# Patient Record
Sex: Female | Born: 1945 | Race: Black or African American | Hispanic: No | Smoking: Former smoker
Health system: Southern US, Community
[De-identification: ages and names within clinical notes are randomized; demographics above are authoritative.]

## PROBLEM LIST (undated history)

## (undated) DIAGNOSIS — E079 Disorder of thyroid, unspecified: Secondary | ICD-10-CM

## (undated) DIAGNOSIS — D509 Iron deficiency anemia, unspecified: Secondary | ICD-10-CM

## (undated) HISTORY — DX: Disorder of thyroid, unspecified: E07.9

## (undated) HISTORY — DX: Iron deficiency anemia, unspecified: D50.9

## (undated) HISTORY — PX: ABDOMINAL HYSTERECTOMY: SHX81

---

## 2008-11-24 ENCOUNTER — Ambulatory Visit (HOSPITAL_COMMUNITY): Admission: RE | Admit: 2008-11-24 | Discharge: 2008-11-24 | Payer: Self-pay | Admitting: Family Medicine

## 2008-12-18 ENCOUNTER — Ambulatory Visit (HOSPITAL_COMMUNITY): Admission: RE | Admit: 2008-12-18 | Discharge: 2008-12-18 | Payer: Self-pay | Admitting: Family Medicine

## 2009-01-13 ENCOUNTER — Ambulatory Visit (HOSPITAL_COMMUNITY): Admission: RE | Admit: 2009-01-13 | Discharge: 2009-01-13 | Payer: Self-pay | Admitting: Family Medicine

## 2009-10-05 ENCOUNTER — Ambulatory Visit (HOSPITAL_COMMUNITY): Admission: RE | Admit: 2009-10-05 | Discharge: 2009-10-05 | Payer: Self-pay | Admitting: Family Medicine

## 2010-02-24 ENCOUNTER — Ambulatory Visit (HOSPITAL_COMMUNITY): Admission: RE | Admit: 2010-02-24 | Discharge: 2010-02-24 | Payer: Self-pay | Admitting: Family Medicine

## 2010-05-03 ENCOUNTER — Ambulatory Visit (HOSPITAL_COMMUNITY): Admission: RE | Admit: 2010-05-03 | Discharge: 2010-05-03 | Payer: Self-pay | Admitting: Obstetrics & Gynecology

## 2011-05-25 ENCOUNTER — Other Ambulatory Visit (HOSPITAL_COMMUNITY): Payer: Self-pay | Admitting: Family Medicine

## 2011-05-25 DIAGNOSIS — Z139 Encounter for screening, unspecified: Secondary | ICD-10-CM

## 2011-05-29 ENCOUNTER — Ambulatory Visit (HOSPITAL_COMMUNITY): Payer: Self-pay

## 2011-06-08 ENCOUNTER — Ambulatory Visit (HOSPITAL_COMMUNITY)
Admission: RE | Admit: 2011-06-08 | Discharge: 2011-06-08 | Disposition: A | Discharge status: Home / Self Care | Payer: Medicaid Other | Source: Ambulatory Visit | Attending: Family Medicine | Admitting: Family Medicine

## 2011-06-08 DIAGNOSIS — Z1231 Encounter for screening mammogram for malignant neoplasm of breast: Secondary | ICD-10-CM | POA: Insufficient documentation

## 2011-06-08 DIAGNOSIS — Z139 Encounter for screening, unspecified: Secondary | ICD-10-CM

## 2011-07-13 ENCOUNTER — Other Ambulatory Visit: Payer: Self-pay | Admitting: Obstetrics & Gynecology

## 2012-05-30 ENCOUNTER — Other Ambulatory Visit (HOSPITAL_COMMUNITY): Payer: Self-pay | Admitting: Family Medicine

## 2012-05-30 DIAGNOSIS — Z139 Encounter for screening, unspecified: Secondary | ICD-10-CM

## 2012-06-10 ENCOUNTER — Ambulatory Visit (HOSPITAL_COMMUNITY)
Admission: RE | Admit: 2012-06-10 | Discharge: 2012-06-10 | Disposition: A | Discharge status: Home / Self Care | Payer: Medicaid Other | Source: Ambulatory Visit | Attending: Family Medicine | Admitting: Family Medicine

## 2012-06-10 DIAGNOSIS — Z139 Encounter for screening, unspecified: Secondary | ICD-10-CM

## 2012-06-10 DIAGNOSIS — Z1231 Encounter for screening mammogram for malignant neoplasm of breast: Secondary | ICD-10-CM | POA: Insufficient documentation

## 2013-06-30 ENCOUNTER — Other Ambulatory Visit (HOSPITAL_COMMUNITY): Payer: Self-pay | Admitting: Family Medicine

## 2013-06-30 DIAGNOSIS — Z139 Encounter for screening, unspecified: Secondary | ICD-10-CM

## 2013-07-15 ENCOUNTER — Ambulatory Visit (HOSPITAL_COMMUNITY)
Admission: RE | Admit: 2013-07-15 | Discharge: 2013-07-15 | Disposition: A | Discharge status: Home / Self Care | Payer: Medicare Other | Source: Ambulatory Visit | Attending: Family Medicine | Admitting: Family Medicine

## 2013-07-15 ENCOUNTER — Encounter: Payer: Self-pay | Admitting: Obstetrics & Gynecology

## 2013-07-15 ENCOUNTER — Ambulatory Visit (INDEPENDENT_AMBULATORY_CARE_PROVIDER_SITE_OTHER): Payer: Medicare Other | Admitting: Obstetrics & Gynecology

## 2013-07-15 VITALS — BP 140/80 | Ht 68.0 in | Wt 141.0 lb

## 2013-07-15 DIAGNOSIS — Z1231 Encounter for screening mammogram for malignant neoplasm of breast: Secondary | ICD-10-CM | POA: Insufficient documentation

## 2013-07-15 DIAGNOSIS — Z1212 Encounter for screening for malignant neoplasm of rectum: Secondary | ICD-10-CM

## 2013-07-15 DIAGNOSIS — Z139 Encounter for screening, unspecified: Secondary | ICD-10-CM

## 2013-07-15 DIAGNOSIS — Z01419 Encounter for gynecological examination (general) (routine) without abnormal findings: Secondary | ICD-10-CM

## 2013-07-15 NOTE — Progress Notes (Signed)
Patient ID: Kendra Ayala, female   DOB: 07/28/1946, 67 y.o.   MRN: [id number] Subjective:     Kendra Ayala is a 67 y.o. female here for a routine exam.  No LMP recorded. Patient has had a hysterectomy. No obstetric history on file. Current complaints: none. .   Gynecologic History No LMP recorded. Patient has had a hysterectomy. Contraception: none Last Pap: na. Results were: na Last mammogram: 2014. Results were: pending  History reviewed. No pertinent past medical history.  History reviewed. No pertinent past surgical history.  OB History   Grav Para Term Preterm Abortions TAB SAB Ect Mult Living                  History   Social History  . Marital Status: Divorced    Spouse Name: N/A    Number of Children: N/A  . Years of Education: N/A   Social History Main Topics  . Smoking status: Former Games developer  . Smokeless tobacco: None  . Alcohol Use: None  . Drug Use: None  . Sexual Activity: None   Other Topics Concern  . None   Social History Narrative  . None    History reviewed. No pertinent family history.   Review of Systems  Review of Systems  Constitutional: Negative for fever, chills, weight loss, malaise/fatigue and diaphoresis.  HENT: Negative for hearing loss, ear pain, nosebleeds, congestion, sore throat, neck pain, tinnitus and ear discharge.   Eyes: Negative for blurred vision, double vision, photophobia, pain, discharge and redness.  Respiratory: Negative for cough, hemoptysis, sputum production, shortness of breath, wheezing and stridor.   Cardiovascular: Negative for chest pain, palpitations, orthopnea, claudication, leg swelling and PND.  Gastrointestinal: negative for abdominal pain. Negative for heartburn, nausea, vomiting, diarrhea, constipation, blood in stool and melena.  Genitourinary: Negative for dysuria, urgency, frequency, hematuria and flank pain.  Musculoskeletal: Negative for myalgias, back pain, joint pain and falls.  Skin: Negative  for itching and rash.  Neurological: Negative for dizziness, tingling, tremors, sensory change, speech change, focal weakness, seizures, loss of consciousness, weakness and headaches.  Endo/Heme/Allergies: Negative for environmental allergies and polydipsia. Does not bruise/bleed easily.  Psychiatric/Behavioral: Negative for depression, suicidal ideas, hallucinations, memory loss and substance abuse. The patient is not nervous/anxious and does not have insomnia.        Objective:    Physical Exam  Vitals reviewed. Constitutional: She is oriented to person, place, and time. She appears well-developed and well-nourished.  HENT:  Head: Normocephalic and atraumatic.        Right Ear: External ear normal.  Left Ear: External ear normal.  Nose: Nose normal.  Mouth/Throat: Oropharynx is clear and moist.  Eyes: Conjunctivae and EOM are normal. Pupils are equal, round, and reactive to light. Right eye exhibits no discharge. Left eye exhibits no discharge. No scleral icterus.  Neck: Normal range of motion. Neck supple. No tracheal deviation present. No thyromegaly present.  Cardiovascular: Normal rate, regular rhythm, normal heart sounds and intact distal pulses.  Exam reveals no gallop and no friction rub.   No murmur heard. Respiratory: Effort normal and breath sounds normal. No respiratory distress. She has no wheezes. She has no rales. She exhibits no tenderness.  GI: Soft. Bowel sounds are normal. She exhibits no distension and no mass. There is no tenderness. There is no rebound and no guarding.  Genitourinary:  Breasts no masses skin changes or nipple changes bilaterally      Vulva is normal without lesions  Vagina is pink moist without discharge Cervix absent Uterus is absent Adnexa is absent Rectal    hemoccult negative, normal tone, no masses  Musculoskeletal: Normal range of motion. She exhibits no edema and no tenderness.  Neurological: She is alert and oriented to person, place,  and time. She has normal reflexes. She displays normal reflexes. No cranial nerve deficit. She exhibits normal muscle tone. Coordination normal.  Skin: Skin is warm and dry. No rash noted. No erythema. No pallor.  Psychiatric: She has a normal mood and affect. Her behavior is normal. Judgment and thought content normal.       Assessment:    Healthy female exam.    Plan:    Follow up in: 1 year.

## 2013-10-20 ENCOUNTER — Other Ambulatory Visit (HOSPITAL_COMMUNITY): Payer: Self-pay | Admitting: Family Medicine

## 2013-10-20 ENCOUNTER — Ambulatory Visit (HOSPITAL_COMMUNITY)
Admission: RE | Admit: 2013-10-20 | Discharge: 2013-10-20 | Disposition: A | Discharge status: Home / Self Care | Payer: Medicare Other | Source: Ambulatory Visit | Attending: Family Medicine | Admitting: Family Medicine

## 2013-10-20 DIAGNOSIS — M5137 Other intervertebral disc degeneration, lumbosacral region: Secondary | ICD-10-CM | POA: Insufficient documentation

## 2013-10-20 DIAGNOSIS — M5430 Sciatica, unspecified side: Secondary | ICD-10-CM

## 2013-10-20 DIAGNOSIS — M47817 Spondylosis without myelopathy or radiculopathy, lumbosacral region: Secondary | ICD-10-CM | POA: Insufficient documentation

## 2013-10-20 DIAGNOSIS — M51379 Other intervertebral disc degeneration, lumbosacral region without mention of lumbar back pain or lower extremity pain: Secondary | ICD-10-CM | POA: Insufficient documentation

## 2013-10-20 DIAGNOSIS — M25562 Pain in left knee: Secondary | ICD-10-CM

## 2013-10-20 DIAGNOSIS — M25569 Pain in unspecified knee: Secondary | ICD-10-CM | POA: Insufficient documentation

## 2014-06-11 ENCOUNTER — Other Ambulatory Visit (HOSPITAL_COMMUNITY): Payer: Self-pay | Admitting: Family Medicine

## 2014-06-11 DIAGNOSIS — Z1231 Encounter for screening mammogram for malignant neoplasm of breast: Secondary | ICD-10-CM

## 2014-07-23 ENCOUNTER — Encounter: Payer: Self-pay | Admitting: Obstetrics & Gynecology

## 2014-07-23 ENCOUNTER — Ambulatory Visit (HOSPITAL_COMMUNITY)
Admission: RE | Admit: 2014-07-23 | Discharge: 2014-07-23 | Disposition: A | Discharge status: Home / Self Care | Payer: Commercial Managed Care - HMO | Source: Ambulatory Visit | Attending: Family Medicine | Admitting: Family Medicine

## 2014-07-23 ENCOUNTER — Ambulatory Visit (INDEPENDENT_AMBULATORY_CARE_PROVIDER_SITE_OTHER): Payer: Commercial Managed Care - HMO | Admitting: Obstetrics & Gynecology

## 2014-07-23 VITALS — BP 100/60 | Ht 67.0 in | Wt 142.0 lb

## 2014-07-23 DIAGNOSIS — Z1272 Encounter for screening for malignant neoplasm of vagina: Secondary | ICD-10-CM | POA: Diagnosis not present

## 2014-07-23 DIAGNOSIS — Z1231 Encounter for screening mammogram for malignant neoplasm of breast: Secondary | ICD-10-CM | POA: Insufficient documentation

## 2014-07-23 DIAGNOSIS — Z90710 Acquired absence of both cervix and uterus: Secondary | ICD-10-CM | POA: Diagnosis not present

## 2014-07-23 DIAGNOSIS — Z1211 Encounter for screening for malignant neoplasm of colon: Secondary | ICD-10-CM | POA: Diagnosis not present

## 2014-07-23 DIAGNOSIS — Z1212 Encounter for screening for malignant neoplasm of rectum: Secondary | ICD-10-CM | POA: Diagnosis not present

## 2014-07-23 DIAGNOSIS — Z01419 Encounter for gynecological examination (general) (routine) without abnormal findings: Secondary | ICD-10-CM

## 2014-07-23 NOTE — Progress Notes (Signed)
Patient ID: Kendra Ayala, female   DOB: 1945/09/27, 68 y.o.   MRN: [id number] Subjective:     Kendra Ayala is a 68 y.o. female here for a routine exam.  No LMP recorded. Patient has had a hysterectomy. No obstetric history on file. Birth Control Method:  na Menstrual Calendar(currently): amenorrheic  Current complaints: none.   Current acute medical issues:  none   Recent Gynecologic History No LMP recorded. Patient has had a hysterectomy. Last Pap: years ago,   Last mammogram: 2015,  normal  History reviewed. No pertinent past medical history.  History reviewed. No pertinent past surgical history.  OB History    No data available      History   Social History  . Marital Status: Divorced    Spouse Name: N/A    Number of Children: N/A  . Years of Education: N/A   Social History Main Topics  . Smoking status: Former Games developermoker  . Smokeless tobacco: None  . Alcohol Use: None  . Drug Use: None  . Sexual Activity: None   Other Topics Concern  . None   Social History Narrative    History reviewed. No pertinent family history.  Current outpatient prescriptions: acetaminophen (TYLENOL) 325 MG tablet, Take 650 mg by mouth every 6 (six) hours as needed for pain., Disp: , Rfl: ;  alendronate (FOSAMAX) 70 MG tablet, Take 70 mg by mouth every 7 (seven) days. Take with a full glass of water on an empty stomach., Disp: , Rfl: ;  calcium & magnesium carbonates (MYLANTA) 311-232 MG per tablet, Take 1 tablet by mouth daily., Disp: , Rfl:  fexofenadine (ALLEGRA) 180 MG tablet, Take 180 mg by mouth daily., Disp: , Rfl: ;  FLUoxetine (PROZAC) 20 MG tablet, Take by mouth daily., Disp: , Rfl: ;  levothyroxine (SYNTHROID, LEVOTHROID) 50 MCG tablet, Take 50 mcg by mouth daily before breakfast., Disp: , Rfl: ;  omeprazole (PRILOSEC) 40 MG capsule, Take 40 mg by mouth daily., Disp: , Rfl:  Vitamin D, Ergocalciferol, (DRISDOL) 50000 UNITS CAPS capsule, Take 50,000 Units by mouth., Disp: , Rfl:    Review of Systems  Review of Systems  Constitutional: Negative for fever, chills, weight loss, malaise/fatigue and diaphoresis.  HENT: Negative for hearing loss, ear pain, nosebleeds, congestion, sore throat, neck pain, tinnitus and ear discharge.   Eyes: Negative for blurred vision, double vision, photophobia, pain, discharge and redness.  Respiratory: Negative for cough, hemoptysis, sputum production, shortness of breath, wheezing and stridor.   Cardiovascular: Negative for chest pain, palpitations, orthopnea, claudication, leg swelling and PND.  Gastrointestinal: negative for abdominal pain. Negative for heartburn, nausea, vomiting, diarrhea, constipation, blood in stool and melena.  Genitourinary: Negative for dysuria, urgency, frequency, hematuria and flank pain.  Musculoskeletal: Negative for myalgias, back pain, joint pain and falls.  Skin: Negative for itching and rash.  Neurological: Negative for dizziness, tingling, tremors, sensory change, speech change, focal weakness, seizures, loss of consciousness, weakness and headaches.  Endo/Heme/Allergies: Negative for environmental allergies and polydipsia. Does not bruise/bleed easily.  Psychiatric/Behavioral: Negative for depression, suicidal ideas, hallucinations, memory loss and substance abuse. The patient is not nervous/anxious and does not have insomnia.        Objective:  Blood pressure 100/60, height 5\' 7"  (1.702 m), weight 142 lb (64.411 kg).   Physical Exam  Vitals reviewed. Constitutional: She is oriented to person, place, and time. She appears well-developed and well-nourished.  HENT:  Head: Normocephalic and atraumatic.  Right Ear: External ear normal.  Left Ear: External ear normal.  Nose: Nose normal.  Mouth/Throat: Oropharynx is clear and moist.  Eyes: Conjunctivae and EOM are normal. Pupils are equal, round, and reactive to light. Right eye exhibits no discharge. Left eye exhibits no discharge. No scleral  icterus.  Neck: Normal range of motion. Neck supple. No tracheal deviation present. No thyromegaly present.  Cardiovascular: Normal rate, regular rhythm, normal heart sounds and intact distal pulses.  Exam reveals no gallop and no friction rub.   No murmur heard. Respiratory: Effort normal and breath sounds normal. No respiratory distress. She has no wheezes. She has no rales. She exhibits no tenderness.  GI: Soft. Bowel sounds are normal. She exhibits no distension and no mass. There is no tenderness. There is no rebound and no guarding.  Genitourinary:  Breasts no masses skin changes or nipple changes bilaterally      Vulva is normal without lesions Vagina is pink moist without discharge Cervix absent Uterus is absent Adnexa is negative  Rectal    hemoccult negative, normal tone, no masses  Musculoskeletal: Normal range of motion. She exhibits no edema and no tenderness.  Neurological: She is alert and oriented to person, place, and time. She has normal reflexes. She displays normal reflexes. No cranial nerve deficit. She exhibits normal muscle tone. Coordination normal.  Skin: Skin is warm and dry. No rash noted. No erythema. No pallor.  Psychiatric: She has a normal mood and affect. Her behavior is normal. Judgment and thought content normal.       Assessment:    Normal gyn yearly exam.    Plan:    Mammogram ordered. Follow up in: 2 years.

## 2014-08-12 ENCOUNTER — Other Ambulatory Visit (HOSPITAL_COMMUNITY): Payer: Self-pay | Admitting: Family Medicine

## 2014-08-12 DIAGNOSIS — M810 Age-related osteoporosis without current pathological fracture: Secondary | ICD-10-CM

## 2014-08-17 ENCOUNTER — Other Ambulatory Visit (HOSPITAL_COMMUNITY): Payer: Medicare HMO

## 2014-08-21 ENCOUNTER — Ambulatory Visit (HOSPITAL_COMMUNITY)
Admission: RE | Admit: 2014-08-21 | Discharge: 2014-08-21 | Disposition: A | Discharge status: Home / Self Care | Payer: Commercial Managed Care - HMO | Source: Ambulatory Visit | Attending: Family Medicine | Admitting: Family Medicine

## 2014-08-21 DIAGNOSIS — M818 Other osteoporosis without current pathological fracture: Secondary | ICD-10-CM | POA: Insufficient documentation

## 2014-08-21 DIAGNOSIS — M810 Age-related osteoporosis without current pathological fracture: Secondary | ICD-10-CM

## 2014-10-05 DIAGNOSIS — M5441 Lumbago with sciatica, right side: Secondary | ICD-10-CM | POA: Diagnosis not present

## 2014-10-05 DIAGNOSIS — E780 Pure hypercholesterolemia: Secondary | ICD-10-CM | POA: Diagnosis not present

## 2014-10-05 DIAGNOSIS — Z79891 Long term (current) use of opiate analgesic: Secondary | ICD-10-CM | POA: Diagnosis not present

## 2014-10-05 DIAGNOSIS — M8580 Other specified disorders of bone density and structure, unspecified site: Secondary | ICD-10-CM | POA: Diagnosis not present

## 2014-10-05 DIAGNOSIS — E559 Vitamin D deficiency, unspecified: Secondary | ICD-10-CM | POA: Diagnosis not present

## 2014-10-05 DIAGNOSIS — M810 Age-related osteoporosis without current pathological fracture: Secondary | ICD-10-CM | POA: Diagnosis not present

## 2014-10-05 DIAGNOSIS — E039 Hypothyroidism, unspecified: Secondary | ICD-10-CM | POA: Diagnosis not present

## 2014-10-05 DIAGNOSIS — M5442 Lumbago with sciatica, left side: Secondary | ICD-10-CM | POA: Diagnosis not present

## 2014-12-31 DIAGNOSIS — H2513 Age-related nuclear cataract, bilateral: Secondary | ICD-10-CM | POA: Diagnosis not present

## 2014-12-31 DIAGNOSIS — H5210 Myopia, unspecified eye: Secondary | ICD-10-CM | POA: Diagnosis not present

## 2014-12-31 DIAGNOSIS — H5200 Hypermetropia, unspecified eye: Secondary | ICD-10-CM | POA: Diagnosis not present

## 2014-12-31 DIAGNOSIS — H2510 Age-related nuclear cataract, unspecified eye: Secondary | ICD-10-CM | POA: Diagnosis not present

## 2015-01-04 DIAGNOSIS — M5442 Lumbago with sciatica, left side: Secondary | ICD-10-CM | POA: Diagnosis not present

## 2015-01-04 DIAGNOSIS — E780 Pure hypercholesterolemia: Secondary | ICD-10-CM | POA: Diagnosis not present

## 2015-01-04 DIAGNOSIS — M810 Age-related osteoporosis without current pathological fracture: Secondary | ICD-10-CM | POA: Diagnosis not present

## 2015-01-04 DIAGNOSIS — M5441 Lumbago with sciatica, right side: Secondary | ICD-10-CM | POA: Diagnosis not present

## 2015-01-27 DIAGNOSIS — E559 Vitamin D deficiency, unspecified: Secondary | ICD-10-CM | POA: Diagnosis not present

## 2015-01-27 DIAGNOSIS — E780 Pure hypercholesterolemia: Secondary | ICD-10-CM | POA: Diagnosis not present

## 2015-01-27 DIAGNOSIS — M5431 Sciatica, right side: Secondary | ICD-10-CM | POA: Diagnosis not present

## 2015-01-27 DIAGNOSIS — M810 Age-related osteoporosis without current pathological fracture: Secondary | ICD-10-CM | POA: Diagnosis not present

## 2015-01-27 DIAGNOSIS — M5432 Sciatica, left side: Secondary | ICD-10-CM | POA: Diagnosis not present

## 2015-04-27 DIAGNOSIS — M5441 Lumbago with sciatica, right side: Secondary | ICD-10-CM | POA: Diagnosis not present

## 2015-04-27 DIAGNOSIS — M5442 Lumbago with sciatica, left side: Secondary | ICD-10-CM | POA: Diagnosis not present

## 2015-04-27 DIAGNOSIS — E559 Vitamin D deficiency, unspecified: Secondary | ICD-10-CM | POA: Diagnosis not present

## 2015-04-27 DIAGNOSIS — M810 Age-related osteoporosis without current pathological fracture: Secondary | ICD-10-CM | POA: Diagnosis not present

## 2015-06-07 DIAGNOSIS — Z0100 Encounter for examination of eyes and vision without abnormal findings: Secondary | ICD-10-CM | POA: Diagnosis not present

## 2015-06-21 ENCOUNTER — Other Ambulatory Visit (HOSPITAL_COMMUNITY): Payer: Self-pay | Admitting: Family Medicine

## 2015-06-21 DIAGNOSIS — Z1231 Encounter for screening mammogram for malignant neoplasm of breast: Secondary | ICD-10-CM

## 2015-07-26 DIAGNOSIS — Z23 Encounter for immunization: Secondary | ICD-10-CM | POA: Diagnosis not present

## 2015-07-26 DIAGNOSIS — M5442 Lumbago with sciatica, left side: Secondary | ICD-10-CM | POA: Diagnosis not present

## 2015-07-26 DIAGNOSIS — F419 Anxiety disorder, unspecified: Secondary | ICD-10-CM | POA: Diagnosis not present

## 2015-07-26 DIAGNOSIS — M5441 Lumbago with sciatica, right side: Secondary | ICD-10-CM | POA: Diagnosis not present

## 2015-07-27 ENCOUNTER — Ambulatory Visit: Payer: Medicare Other | Admitting: Obstetrics & Gynecology

## 2015-07-28 ENCOUNTER — Ambulatory Visit (HOSPITAL_COMMUNITY): Payer: Medicare HMO

## 2015-07-28 ENCOUNTER — Encounter: Payer: Self-pay | Admitting: Obstetrics & Gynecology

## 2015-07-28 ENCOUNTER — Ambulatory Visit (HOSPITAL_COMMUNITY)
Admission: RE | Admit: 2015-07-28 | Discharge: 2015-07-28 | Disposition: A | Discharge status: Home / Self Care | Payer: Commercial Managed Care - HMO | Source: Ambulatory Visit | Attending: Family Medicine | Admitting: Family Medicine

## 2015-07-28 ENCOUNTER — Ambulatory Visit (INDEPENDENT_AMBULATORY_CARE_PROVIDER_SITE_OTHER): Payer: Commercial Managed Care - HMO | Admitting: Obstetrics & Gynecology

## 2015-07-28 VITALS — BP 126/78 | HR 60 | Ht 67.0 in | Wt 150.5 lb

## 2015-07-28 DIAGNOSIS — Z1212 Encounter for screening for malignant neoplasm of rectum: Secondary | ICD-10-CM

## 2015-07-28 DIAGNOSIS — Z1211 Encounter for screening for malignant neoplasm of colon: Secondary | ICD-10-CM | POA: Diagnosis not present

## 2015-07-28 DIAGNOSIS — Z1231 Encounter for screening mammogram for malignant neoplasm of breast: Secondary | ICD-10-CM | POA: Diagnosis not present

## 2015-07-28 NOTE — Progress Notes (Signed)
Patient ID: Kendra DollarBrenda L Jeanbaptiste, female   DOB: June 12, 1946, 69 y.o.   MRN: [id number] Subjective:     Kendra DollarBrenda L Trott is a 69 y.o. female here for a routine exam.  No LMP recorded. Patient has had a hysterectomy. No obstetric history on file. Birth Control Method:  hysterectomy Menstrual Calendar(currently): hysterectomy  Current complaints: none.   Current acute medical issues:  none   Recent Gynecologic History No LMP recorded. Patient has had a hysterectomy. Last Pap: ,   Last mammogram: 2016,  normal  Past Medical History  Diagnosis Date  . Thyroid disease     Past Surgical History  Procedure Laterality Date  . Abdominal hysterectomy      OB History    No data available      Social History   Social History  . Marital Status: Divorced    Spouse Name: N/A  . Number of Children: N/A  . Years of Education: N/A   Social History Main Topics  . Smoking status: Former Games developermoker  . Smokeless tobacco: Never Used  . Alcohol Use: 0.0 oz/week    0 Standard drinks or equivalent per week     Comment: wine  . Drug Use: No  . Sexual Activity: Yes   Other Topics Concern  . None   Social History Narrative    History reviewed. No pertinent family history.   Current outpatient prescriptions:  .  acetaminophen (TYLENOL) 325 MG tablet, Take 650 mg by mouth every 6 (six) hours as needed for pain., Disp: , Rfl:  .  alendronate (FOSAMAX) 70 MG tablet, Take 70 mg by mouth every 7 (seven) days. Take with a full glass of water on an empty stomach., Disp: , Rfl:  .  atorvastatin (LIPITOR) 20 MG tablet, Take 20 mg by mouth daily., Disp: , Rfl:  .  diazepam (VALIUM) 5 MG tablet, Take 5 mg by mouth 2 (two) times daily., Disp: , Rfl:  .  FLUoxetine (PROZAC) 20 MG tablet, Take 20 mg by mouth 4 (four) times daily. , Disp: , Rfl:  .  HYDROcodone-acetaminophen (NORCO) 7.5-325 MG tablet, Take 1 tablet by mouth every 6 (six) hours as needed for moderate pain., Disp: , Rfl:  .  levothyroxine (SYNTHROID,  LEVOTHROID) 50 MCG tablet, Take 125 mcg by mouth daily. 1/2/tablet by mouth every day, Disp: , Rfl:  .  naproxen (NAPROSYN) 500 MG tablet, Take 500 mg by mouth 2 (two) times daily with a meal., Disp: , Rfl:  .  omeprazole (PRILOSEC) 40 MG capsule, Take 40 mg by mouth daily., Disp: , Rfl:  .  Vitamin D, Ergocalciferol, (DRISDOL) 50000 UNITS CAPS capsule, Take 50,000 Units by mouth., Disp: , Rfl:  .  calcium & magnesium carbonates (MYLANTA) 311-232 MG per tablet, Take 1 tablet by mouth daily., Disp: , Rfl:  .  fexofenadine (ALLEGRA) 180 MG tablet, Take 180 mg by mouth daily., Disp: , Rfl:   Review of Systems  Review of Systems  Constitutional: Negative for fever, chills, weight loss, malaise/fatigue and diaphoresis.  HENT: Negative for hearing loss, ear pain, nosebleeds, congestion, sore throat, neck pain, tinnitus and ear discharge.   Eyes: Negative for blurred vision, double vision, photophobia, pain, discharge and redness.  Respiratory: Negative for cough, hemoptysis, sputum production, shortness of breath, wheezing and stridor.   Cardiovascular: Negative for chest pain, palpitations, orthopnea, claudication, leg swelling and PND.  Gastrointestinal: negative for abdominal pain. Negative for heartburn, nausea, vomiting, diarrhea, constipation, blood in stool and melena.  Genitourinary:  Negative for dysuria, urgency, frequency, hematuria and flank pain.  Musculoskeletal: Negative for myalgias, back pain, joint pain and falls.  Skin: Negative for itching and rash.  Neurological: Negative for dizziness, tingling, tremors, sensory change, speech change, focal weakness, seizures, loss of consciousness, weakness and headaches.  Endo/Heme/Allergies: Negative for environmental allergies and polydipsia. Does not bruise/bleed easily.  Psychiatric/Behavioral: Negative for depression, suicidal ideas, hallucinations, memory loss and substance abuse. The patient is not nervous/anxious and does not have  insomnia.        Objective:  Blood pressure 126/78, pulse 60, height  (1.702 m), weight 150 lb 8 oz (68.266 kg).   Physical Exam  Vitals reviewed. Constitutional: She is oriented to person, place, and time. She appears well-developed and well-nourished.  HENT:  Head: Normocephalic and atraumatic.        Right Ear: External ear normal.  Left Ear: External ear normal.  Nose: Nose normal.  Mouth/Throat: Oropharynx is clear and moist.  Eyes: Conjunctivae and EOM are normal. Pupils are equal, round, and reactive to light. Right eye exhibits no discharge. Left eye exhibits no discharge. No scleral icterus.  Neck: Normal range of motion. Neck supple. No tracheal deviation present. No thyromegaly present.  Cardiovascular: Normal rate, regular rhythm, normal heart sounds and intact distal pulses.  Exam reveals no gallop and no friction rub.   No murmur heard. Respiratory: Effort normal and breath sounds normal. No respiratory distress. She has no wheezes. She has no rales. She exhibits no tenderness.  GI: Soft. Bowel sounds are normal. She exhibits no distension and no mass. There is no tenderness. There is no rebound and no guarding.  Genitourinary:  Breasts no masses skin changes or nipple changes bilaterally      Vulva is normal without lesions Vagina is pink moist without discharge Cervix absent Uterus is absent Adnexa is negative* {Rectal    hemoccult negative, normal tone, no masses  Musculoskeletal: Normal range of motion. She exhibits no edema and no tenderness.  Neurological: She is alert and oriented to person, place, and time. She has normal reflexes. She displays normal reflexes. No cranial nerve deficit. She exhibits normal muscle tone. Coordination normal.  Skin: Skin is warm and dry. No rash noted. No erythema. No pallor.  Psychiatric: She has a normal mood and affect. Her behavior is normal. Judgment and thought content normal.       Assessment:    Healthy female  exam.    Plan:    Mammogram ordered. Follow up in: 2 years.

## 2015-10-28 DIAGNOSIS — R634 Abnormal weight loss: Secondary | ICD-10-CM | POA: Diagnosis not present

## 2015-10-28 DIAGNOSIS — E039 Hypothyroidism, unspecified: Secondary | ICD-10-CM | POA: Diagnosis not present

## 2015-10-28 DIAGNOSIS — E559 Vitamin D deficiency, unspecified: Secondary | ICD-10-CM | POA: Diagnosis not present

## 2015-11-24 DIAGNOSIS — R634 Abnormal weight loss: Secondary | ICD-10-CM | POA: Diagnosis not present

## 2015-11-24 DIAGNOSIS — E039 Hypothyroidism, unspecified: Secondary | ICD-10-CM | POA: Diagnosis not present

## 2015-11-24 DIAGNOSIS — E559 Vitamin D deficiency, unspecified: Secondary | ICD-10-CM | POA: Diagnosis not present

## 2015-11-24 DIAGNOSIS — E7800 Pure hypercholesterolemia, unspecified: Secondary | ICD-10-CM | POA: Diagnosis not present

## 2015-11-24 DIAGNOSIS — R799 Abnormal finding of blood chemistry, unspecified: Secondary | ICD-10-CM | POA: Diagnosis not present

## 2016-01-28 ENCOUNTER — Encounter (INDEPENDENT_AMBULATORY_CARE_PROVIDER_SITE_OTHER): Payer: Self-pay | Admitting: *Deleted

## 2016-02-23 ENCOUNTER — Encounter (INDEPENDENT_AMBULATORY_CARE_PROVIDER_SITE_OTHER): Payer: Self-pay

## 2016-02-23 ENCOUNTER — Other Ambulatory Visit (INDEPENDENT_AMBULATORY_CARE_PROVIDER_SITE_OTHER): Payer: Self-pay | Admitting: *Deleted

## 2016-02-23 ENCOUNTER — Encounter (INDEPENDENT_AMBULATORY_CARE_PROVIDER_SITE_OTHER): Payer: Self-pay | Admitting: *Deleted

## 2016-02-23 ENCOUNTER — Other Ambulatory Visit (INDEPENDENT_AMBULATORY_CARE_PROVIDER_SITE_OTHER): Payer: Self-pay | Admitting: Internal Medicine

## 2016-02-23 ENCOUNTER — Ambulatory Visit (INDEPENDENT_AMBULATORY_CARE_PROVIDER_SITE_OTHER): Payer: Medicare HMO | Admitting: Internal Medicine

## 2016-02-23 ENCOUNTER — Encounter (INDEPENDENT_AMBULATORY_CARE_PROVIDER_SITE_OTHER): Payer: Self-pay | Admitting: Internal Medicine

## 2016-02-23 VITALS — BP 94/58 | HR 76 | Temp 97.7°F | Ht 67.0 in | Wt 133.3 lb

## 2016-02-23 DIAGNOSIS — D509 Iron deficiency anemia, unspecified: Secondary | ICD-10-CM

## 2016-02-23 DIAGNOSIS — R195 Other fecal abnormalities: Secondary | ICD-10-CM | POA: Diagnosis not present

## 2016-02-23 DIAGNOSIS — D649 Anemia, unspecified: Secondary | ICD-10-CM | POA: Insufficient documentation

## 2016-02-23 DIAGNOSIS — E039 Hypothyroidism, unspecified: Secondary | ICD-10-CM | POA: Insufficient documentation

## 2016-02-23 LAB — CBC
HCT: 37.7 % (ref 35.0–45.0)
Hemoglobin: 12.5 g/dL (ref 11.7–15.5)
MCH: 26.3 pg — AB (ref 27.0–33.0)
MCHC: 33.2 g/dL (ref 32.0–36.0)
MCV: 79.4 fL — AB (ref 80.0–100.0)
MPV: 9.2 fL (ref 7.5–12.5)
Platelets: 295 10*3/uL (ref 140–400)
RBC: 4.75 MIL/uL (ref 3.80–5.10)
RDW: 16.3 % — ABNORMAL HIGH (ref 11.0–15.0)
WBC: 3.4 10*3/uL — ABNORMAL LOW (ref 3.8–10.8)

## 2016-02-23 MED ORDER — PEG 3350-KCL-NA BICARB-NACL 420 G PO SOLR: 4000.0000 mL | Freq: Once | ORAL | Status: AC

## 2016-02-23 NOTE — Telephone Encounter (Signed)
Patient needs trilyte 

## 2016-02-23 NOTE — Patient Instructions (Signed)
Colonoscopy.  The risks and benefits such as perforation, bleeding, and infection were reviewed with the patient and is agreeable. 

## 2016-02-23 NOTE — Progress Notes (Addendum)
Subjective:    Patient ID: Kendra Ayala, female    DOB: 13-Jan-1946, 70 y.o.   MRN: [id number]  HPI Referred by Dr. Sudie Bailey for anemia. She denies prior hx of anemia.  She denies any rectal bleeding. No hematemesis. No family hx of colon cancer. She has lost about 30 pounds since 2010. She moved from [place] to here in 2010.  She says she is depressed. She does not have family here. Her appetite is not good. She does not like the food here in [place]. She denies any abdominal pain. She says she has had diarrhea since April.  She is having three loose stools a day. She says she has not had a BM since Monday. She rarely takes Naproxen. She does have some acid reflux since 2011. Her last colonoscopy was at [facility] in 2010 which revealed a few diverticula at ascending colon. A 3mm polyp ablated from sigmoid colon. Dr. Karilyn Cota. Biopsy: tubular adenoma.  Patient has Depend on due to occasional incontinence. No recent antibiotics.    11/25/2015 H and H 10.6 and 34.0, MCV 79.5, Platelet ct 252., Ferritin 9, Folate 16.9, Iron 56, Iron binding capacity 387, % saturation 14,, Vitamin B12 519, TSH 1.78  Review of Systems Past Medical History  Diagnosis Date  . Thyroid disease   . IDA (iron deficiency anemia)     Past Surgical History  Procedure Laterality Date  . Abdominal hysterectomy      Allergies  Allergen Reactions  . Asa [Aspirin] Other (See Comments)    "stomach burns"    Current Outpatient Prescriptions on File Prior to Visit  Medication Sig Dispense Refill  . acetaminophen (TYLENOL) 325 MG tablet Take 650 mg by mouth every 6 (six) hours as needed for pain.    Marland Kitchen alendronate (FOSAMAX) 70 MG tablet Take 70 mg by mouth every 7 (seven) days. Take with a full glass of water on an empty stomach.    Marland Kitchen atorvastatin (LIPITOR) 20 MG tablet Take 20 mg by mouth daily.    . calcium & magnesium carbonates (MYLANTA) 311-232 MG per tablet Take 1 tablet by mouth daily.    . diazepam (VALIUM) 5 MG  tablet Take 5 mg by mouth 2 (two) times daily.    Marland Kitchen FLUoxetine (PROZAC) 20 MG tablet Take 20 mg by mouth 4 (four) times daily.     Marland Kitchen HYDROcodone-acetaminophen (NORCO) 7.5-325 MG tablet Take 1 tablet by mouth every 6 (six) hours as needed for moderate pain.    Marland Kitchen levothyroxine (SYNTHROID, LEVOTHROID) 50 MCG tablet Take 125 mcg by mouth daily. 1/2/tablet by mouth every day    . naproxen (NAPROSYN) 500 MG tablet Take 500 mg by mouth 2 (two) times daily with a meal.    . omeprazole (PRILOSEC) 40 MG capsule Take 40 mg by mouth daily.     No current facility-administered medications on file prior to visit.        Objective:   Physical Exam Blood pressure 94/58, pulse 76, temperature 97.7 F (36.5 C), height  (1.702 m), weight 133 lb 4.8 oz (60.464 kg). Alert and oriented. Skin warm and dry. Oral mucosa is moist.   . Sclera anicteric, conjunctivae is pink. Thyroid not enlarged. No cervical lymphadenopathy. Lungs clear. Heart regular rate and rhythm.  Abdomen is soft. Bowel sounds are positive. No hepatomegaly. No abdominal masses felt. No tenderness.  No edema to lower extremities.  Stool brown and guaiac negative.     Lot [id number] Ex 9/17  Assessment & Plan:  Anemia. Colonic neoplasm needs to be ruled out.  Change in stool: Am going to get a GI pathogen for completeness.  Colonoscopy. The risks and benefits such as perforation, bleeding, and infection were reviewed with the patient and is agreeable.

## 2016-02-28 ENCOUNTER — Telehealth (INDEPENDENT_AMBULATORY_CARE_PROVIDER_SITE_OTHER): Payer: Self-pay | Admitting: Internal Medicine

## 2016-02-28 NOTE — Telephone Encounter (Signed)
Patient called and stated that she was returning a call for results  [phone]

## 2016-02-29 NOTE — Telephone Encounter (Signed)
Results have already been given to patient.  Waiting on GI pathogen

## 2016-02-29 NOTE — Telephone Encounter (Signed)
Patient called again, stated that she missed Terri's call  [phone]

## 2016-02-29 NOTE — Telephone Encounter (Signed)
I have spoken with patient 

## 2016-03-10 ENCOUNTER — Encounter (HOSPITAL_COMMUNITY): Payer: Self-pay | Admitting: Emergency Medicine

## 2016-03-10 ENCOUNTER — Emergency Department (HOSPITAL_COMMUNITY): Payer: Medicare HMO

## 2016-03-10 ENCOUNTER — Emergency Department (HOSPITAL_COMMUNITY)
Admission: EM | Admit: 2016-03-10 | Discharge: 2016-03-10 | Disposition: A | Discharge status: Home / Self Care | Payer: Medicare HMO | Attending: Emergency Medicine | Admitting: Emergency Medicine

## 2016-03-10 DIAGNOSIS — R1084 Generalized abdominal pain: Secondary | ICD-10-CM | POA: Diagnosis not present

## 2016-03-10 DIAGNOSIS — R109 Unspecified abdominal pain: Secondary | ICD-10-CM

## 2016-03-10 DIAGNOSIS — R112 Nausea with vomiting, unspecified: Secondary | ICD-10-CM

## 2016-03-10 DIAGNOSIS — Z87891 Personal history of nicotine dependence: Secondary | ICD-10-CM | POA: Insufficient documentation

## 2016-03-10 DIAGNOSIS — R197 Diarrhea, unspecified: Secondary | ICD-10-CM | POA: Insufficient documentation

## 2016-03-10 DIAGNOSIS — Z79899 Other long term (current) drug therapy: Secondary | ICD-10-CM | POA: Diagnosis not present

## 2016-03-10 LAB — COMPREHENSIVE METABOLIC PANEL
ALT: 12 U/L — ABNORMAL LOW (ref 14–54)
ANION GAP: 12 (ref 5–15)
AST: 21 U/L (ref 15–41)
Albumin: 3.5 g/dL (ref 3.5–5.0)
Alkaline Phosphatase: 59 U/L (ref 38–126)
BILIRUBIN TOTAL: 0.6 mg/dL (ref 0.3–1.2)
BUN: 6 mg/dL (ref 6–20)
CO2: 27 mmol/L (ref 22–32)
Calcium: 9.3 mg/dL (ref 8.9–10.3)
Chloride: 96 mmol/L — ABNORMAL LOW (ref 101–111)
Creatinine, Ser: 0.86 mg/dL (ref 0.44–1.00)
Glucose, Bld: 107 mg/dL — ABNORMAL HIGH (ref 65–99)
POTASSIUM: 3.9 mmol/L (ref 3.5–5.1)
Sodium: 135 mmol/L (ref 135–145)
TOTAL PROTEIN: 8.3 g/dL — AB (ref 6.5–8.1)

## 2016-03-10 LAB — CBC
HEMATOCRIT: 34.4 % — AB (ref 36.0–46.0)
Hemoglobin: 11.8 g/dL — ABNORMAL LOW (ref 12.0–15.0)
MCH: 26.6 pg (ref 26.0–34.0)
MCHC: 34.3 g/dL (ref 30.0–36.0)
MCV: 77.7 fL — ABNORMAL LOW (ref 78.0–100.0)
Platelets: 411 10*3/uL — ABNORMAL HIGH (ref 150–400)
RBC: 4.43 MIL/uL (ref 3.87–5.11)
RDW: 14 % (ref 11.5–15.5)
WBC: 7.1 10*3/uL (ref 4.0–10.5)

## 2016-03-10 LAB — URINE MICROSCOPIC-ADD ON

## 2016-03-10 LAB — URINALYSIS, ROUTINE W REFLEX MICROSCOPIC
Bilirubin Urine: NEGATIVE
Glucose, UA: NEGATIVE mg/dL
KETONES UR: NEGATIVE mg/dL
NITRITE: NEGATIVE
PH: 6 (ref 5.0–8.0)
PROTEIN: NEGATIVE mg/dL
Specific Gravity, Urine: <1.005 — ABNORMAL LOW (ref 1.005–1.030)

## 2016-03-10 LAB — LIPASE, BLOOD: Lipase: 18 U/L (ref 11–51)

## 2016-03-10 MED ORDER — ONDANSETRON HCL 4 MG/2ML IJ SOLN
4.0000 mg | INTRAMUSCULAR | Status: DC | PRN
  Administered 2016-03-10: 4 mg via INTRAVENOUS
  Filled 2016-03-10: qty 2

## 2016-03-10 MED ORDER — DIATRIZOATE MEGLUMINE & SODIUM 66-10 % PO SOLN
ORAL | Status: AC
  Filled 2016-03-10: qty 30

## 2016-03-10 MED ORDER — HYDROMORPHONE HCL 1 MG/ML IJ SOLN
1.0000 mg | Freq: Once | INTRAMUSCULAR | Status: AC
  Administered 2016-03-10: 1 mg via INTRAVENOUS
  Filled 2016-03-10: qty 1

## 2016-03-10 MED ORDER — DICYCLOMINE HCL 10 MG/ML IM SOLN
20.0000 mg | Freq: Once | INTRAMUSCULAR | Status: AC
  Administered 2016-03-10: 20 mg via INTRAMUSCULAR
  Filled 2016-03-10: qty 2

## 2016-03-10 MED ORDER — ONDANSETRON 4 MG PO TBDP: 4.0000 mg | ORAL_TABLET | Freq: Three times a day (TID) | ORAL | Status: DC | PRN

## 2016-03-10 MED ORDER — IOPAMIDOL (ISOVUE-300) INJECTION 61%
100.0000 mL | Freq: Once | INTRAVENOUS | Status: AC | PRN
  Administered 2016-03-10: 100 mL via INTRAVENOUS

## 2016-03-10 MED ORDER — SODIUM CHLORIDE 0.9 % IV SOLN
INTRAVENOUS | Status: DC
  Administered 2016-03-10: 13:00:00 via INTRAVENOUS

## 2016-03-10 NOTE — ED Notes (Signed)
Pt states that she has to use restroom, pt assisted to restroom, small amount of diarrhea noted,

## 2016-03-10 NOTE — ED Notes (Signed)
Pt has finished contrast, ct notified,  

## 2016-03-10 NOTE — ED Provider Notes (Signed)
CSN: [id number]     Arrival date & time 03/10/16  1123 History   First MD Initiated Contact with Patient 03/10/16 1221     Chief Complaint  Patient presents with  . Diarrhea    since April 19th      HPI Pt was seen at 1220.  Per pt, c/o gradual onset and persistence of constant generalized abd "pain" for the past 2 months. Has been associated with multiple intermittent episodes of diarrhea and N/V.  States she "can't keep any food down." Describes the abd pain as "stabbing." Pt has been evaluated by her PMD and GI MD for her symptoms, and is scheduled for a colonoscopy in 2 months.  Denies fevers, no back pain, no rash, no CP/SOB, no black or blood in stools or emesis.      Past Medical History  Diagnosis Date  . Thyroid disease   . IDA (iron deficiency anemia)    Past Surgical History  Procedure Laterality Date  . Abdominal hysterectomy      Social History  Substance Use Topics  . Smoking status: Former Games developermoker  . Smokeless tobacco: Never Used     Comment: quit smoking in the 60s  . Alcohol Use: 0.0 oz/week    0 Standard drinks or equivalent per week     Comment: wine    Review of Systems ROS: Statement: All systems negative except as marked or noted in the HPI; Constitutional: Negative for fever and chills. ; ; Eyes: Negative for eye pain, redness and discharge. ; ; ENMT: Negative for ear pain, hoarseness, sore throat. +nasal congestion, sinus pressure, rhinorrhea. ; ; Cardiovascular: Negative for chest pain, palpitations, diaphoresis, dyspnea and peripheral edema. ; ; Respiratory: Negative for cough, wheezing and stridor. ; ; Gastrointestinal: +N/V/D, abd pain. Negative for blood in stool, hematemesis, jaundice and rectal bleeding. . ; ; Genitourinary: Negative for dysuria, flank pain and hematuria. ; ; Musculoskeletal: Negative for back pain and neck pain. Negative for swelling and trauma.; ; Skin: Negative for pruritus, rash, abrasions, blisters, bruising and skin lesion.; ;  Neuro: Negative for headache, lightheadedness and neck stiffness. Negative for weakness, altered level of consciousness, altered mental status, extremity weakness, paresthesias, involuntary movement, seizure and syncope.      Allergies  Asa  Home Medications   Prior to Admission medications   Medication Sig Start Date End Date Taking? Authorizing Provider  acetaminophen (TYLENOL) 325 MG tablet Take 650 mg by mouth every 6 (six) hours as needed for pain.   Yes Historical Provider, MD  alendronate (FOSAMAX) 70 MG tablet Take 70 mg by mouth every 7 (seven) days. Take on Friday. Take with a full glass of water on an empty stomach.   Yes Historical Provider, MD  diazepam (VALIUM) 5 MG tablet Take 5 mg by mouth 2 (two) times daily as needed for anxiety.    Yes Historical Provider, MD  FLUoxetine (PROZAC) 20 MG tablet Take 20 mg by mouth 4 (four) times daily.    Yes Historical Provider, MD  HYDROcodone-acetaminophen (NORCO) 7.5-325 MG tablet Take 1 tablet by mouth every 6 (six) hours as needed for moderate pain.   Yes Historical Provider, MD  levothyroxine (SYNTHROID, LEVOTHROID) 125 MCG tablet Take 62.5 mcg by mouth daily before breakfast.    Yes Historical Provider, MD  zolpidem (AMBIEN) 10 MG tablet Take 10 mg by mouth at bedtime as needed for sleep.   Yes Historical Provider, MD  atorvastatin (LIPITOR) 20 MG tablet Take 20 mg by mouth  daily.    Historical Provider, MD  polyethylene glycol-electrolytes (TRILYTE) 420 g solution Take 4,000 mLs by mouth once. 02/23/16   Terri L Setzer, NP   BP 138/82 mmHg  Pulse 75  Temp(Src) 98.7 F (37.1 C)  Resp 18  Ht  (1.702 m)  Wt 133 lb (60.328 kg)  BMI 20.83 kg/m2  SpO2 98% Physical Exam  1225: Physical examination:  Nursing notes reviewed; Vital signs and O2 SAT reviewed;  Constitutional: Well developed, Well nourished, Well hydrated, In no acute distress; Head:  Normocephalic, atraumatic; Eyes: EOMI, PERRL, No scleral icterus; ENMT: Mouth and  pharynx normal, Mucous membranes moist. +edemetous nasal turbinates bilat with clear rhinorrhea. ; Neck: Supple, Full range of motion, No lymphadenopathy; Cardiovascular: Regular rate and rhythm, No murmur, rub, or gallop; Respiratory: Breath sounds clear & equal bilaterally, No rales, rhonchi, wheezes.  Speaking full sentences with ease, Normal respiratory effort/excursion; Chest: Nontender, Movement normal; Abdomen: Soft, +diffuse tenderness to palp. No rebound or guarding. Nondistended, Normal bowel sounds; Genitourinary: No CVA tenderness; Extremities: Pulses normal, No tenderness, No edema, No calf edema or asymmetry.; Neuro: AA&Ox3, Major CN grossly intact.  Speech clear. No gross focal motor or sensory deficits in extremities.; Skin: Color normal, Warm, Dry.   ED Course  Procedures (including critical care time) Labs Review  Imaging Review  I have personally reviewed and evaluated these images and lab results as part of my medical decision-making.   EKG Interpretation None      MDM  MDM Reviewed: nursing note, previous chart and vitals Reviewed previous: labs Interpretation: labs and x-ray     Results for orders placed or performed during the hospital encounter of 03/10/16  Lipase, blood  Result Value Ref Range   Lipase 18 11 - 51 U/L  Comprehensive metabolic panel  Result Value Ref Range   Sodium 135 135 - 145 mmol/L   Potassium 3.9 3.5 - 5.1 mmol/L   Chloride 96 (L) 101 - 111 mmol/L   CO2 27 22 - 32 mmol/L   Glucose, Bld 107 (H) 65 - 99 mg/dL   BUN 6 6 - 20 mg/dL   Creatinine, Ser 1.91 0.44 - 1.00 mg/dL   Calcium 9.3 8.9 - 47.8 mg/dL   Total Protein 8.3 (H) 6.5 - 8.1 g/dL   Albumin 3.5 3.5 - 5.0 g/dL   AST 21 15 - 41 U/L   ALT 12 (L) 14 - 54 U/L   Alkaline Phosphatase 59 38 - 126 U/L   Total Bilirubin 0.6 0.3 - 1.2 mg/dL   GFR calc non Af Amer >60 >60 mL/min   GFR calc Af Amer >60 >60 mL/min   Anion gap 12 5 - 15  CBC  Result Value Ref Range   WBC 7.1 4.0 -  10.5 K/uL   RBC 4.43 3.87 - 5.11 MIL/uL   Hemoglobin 11.8 (L) 12.0 - 15.0 g/dL   HCT 29.5 (L) 62.1 - 30.8 %   MCV 77.7 (L) 78.0 - 100.0 fL   MCH 26.6 26.0 - 34.0 pg   MCHC 34.3 30.0 - 36.0 g/dL   RDW 65.7 84.6 - 96.2 %   Platelets 411 (H) 150 - 400 K/uL   Dg Chest 2 View 03/10/2016  CLINICAL DATA:  Productive cough, congestion and shortness of breath. EXAM: CHEST  2 VIEW COMPARISON:  None. FINDINGS: Cardiac silhouette is normal in size. Aorta is mildly uncoiled. No mediastinal or hilar masses or evidence of adenopathy. Clear lungs.  No pleural effusion or pneumothorax.  Bony thorax is intact. IMPRESSION: No active cardiopulmonary disease. Electronically Signed   By: Amie Portlandavid  Ormond M.D.   On: 03/10/2016 14:33    1545:  No N/V or stooling while in the ED. UA and CT A/P pending. Sign out to Dr. Deretha EmoryZackowski.   Samuel JesterKathleen Alaiah Lundy, DO 03/10/16 1545

## 2016-03-10 NOTE — ED Notes (Signed)
Pt returned from ct, sitting on side of bed, c/o lower back/tailbone pain, states that she has hx of chronic pain in this area, requesting pain medication, Dr Deretha EmoryZackowski notified, no additional orders given, pt updated, warm blanket placed behind back for comfort,

## 2016-03-10 NOTE — Discharge Instructions (Signed)
Continue pain medicine you have at home. Make an appointment to follow-up with your regular doctor. Today's CT scan of the abdomen without any acute findings. Also continue your follow-up with GI medicine. Take Zofran as needed for nausea and vomiting.

## 2016-03-10 NOTE — ED Notes (Signed)
Having diarrhea since April 19 th.  Seen Dr Tommie RaymondKnowlin and [facility].  Not able to keep food down.  Have appointment for colonoscopy in August.  Last BM yesterday.

## 2016-03-12 LAB — URINE CULTURE

## 2016-03-13 ENCOUNTER — Telehealth (INDEPENDENT_AMBULATORY_CARE_PROVIDER_SITE_OTHER): Payer: Self-pay | Admitting: Internal Medicine

## 2016-03-13 NOTE — Telephone Encounter (Signed)
Imodium for the diarrhea. Get the stool specimen before starting the Imodium.

## 2016-03-13 NOTE — Telephone Encounter (Signed)
Patient called, scheduled for a colonoscopy 04/20/16.  Went to the ER Friday, losing a lot of weight, can't keep anything down.  She wants to know if there is something that she can take or eat to help her keep food down.  [phone]

## 2016-03-16 LAB — GASTROINTESTINAL PATHOGEN PANEL PCR
C. difficile Tox A/B, PCR: NOT DETECTED
CAMPYLOBACTER, PCR: NOT DETECTED
Cryptosporidium, PCR: NOT DETECTED
E COLI (STEC) STX1/STX2, PCR: NOT DETECTED
E COLI 0157, PCR: NOT DETECTED
E coli (ETEC) LT/ST PCR: NOT DETECTED
Giardia lamblia, PCR: NOT DETECTED
Norovirus, PCR: NOT DETECTED
Rotavirus A, PCR: NOT DETECTED
SALMONELLA, PCR: DETECTED — AB
SHIGELLA, PCR: NOT DETECTED

## 2016-03-17 ENCOUNTER — Telehealth (INDEPENDENT_AMBULATORY_CARE_PROVIDER_SITE_OTHER): Payer: Self-pay | Admitting: Internal Medicine

## 2016-03-17 ENCOUNTER — Telehealth (INDEPENDENT_AMBULATORY_CARE_PROVIDER_SITE_OTHER): Payer: Self-pay | Admitting: *Deleted

## 2016-03-17 DIAGNOSIS — A029 Salmonella infection, unspecified: Secondary | ICD-10-CM

## 2016-03-17 MED ORDER — AZITHROMYCIN 500 MG PO TABS: 500.0000 mg | ORAL_TABLET | Freq: Every day | ORAL | Status: DC

## 2016-03-17 NOTE — Telephone Encounter (Signed)
Rx for Zithromycin sent to her pharmacy.

## 2016-03-17 NOTE — Telephone Encounter (Signed)
Revonda StandardAllison from [facility] called and gave a critical report on this patient. She states that the patient is positive for Simonelli.  I gave Camelia Engerri the result , she was aware and will reach out to the patient.

## 2016-03-23 ENCOUNTER — Telehealth (INDEPENDENT_AMBULATORY_CARE_PROVIDER_SITE_OTHER): Payer: Self-pay | Admitting: Internal Medicine

## 2016-03-23 ENCOUNTER — Other Ambulatory Visit (INDEPENDENT_AMBULATORY_CARE_PROVIDER_SITE_OTHER): Payer: Self-pay | Admitting: Internal Medicine

## 2016-03-23 DIAGNOSIS — R197 Diarrhea, unspecified: Secondary | ICD-10-CM

## 2016-03-23 NOTE — Telephone Encounter (Signed)
Will get another GI pathogen.

## 2016-03-23 NOTE — Telephone Encounter (Signed)
Patient called, still having the same problem and is out of her medication.  [facility]  [phone]

## 2016-03-23 NOTE — Telephone Encounter (Signed)
Says she is still having diarrhea. Am going to repeat a GI pathogen.

## 2016-04-06 ENCOUNTER — Telehealth (INDEPENDENT_AMBULATORY_CARE_PROVIDER_SITE_OTHER): Payer: Self-pay | Admitting: Internal Medicine

## 2016-04-06 NOTE — Telephone Encounter (Signed)
Patient stated she was returning your call  [phone]

## 2016-04-10 ENCOUNTER — Telehealth (INDEPENDENT_AMBULATORY_CARE_PROVIDER_SITE_OTHER): Payer: Self-pay | Admitting: Internal Medicine

## 2016-04-10 LAB — OTHER SOLSTAS TEST

## 2016-04-10 NOTE — Telephone Encounter (Signed)
I have talked with patient. Stool studies are normal. She feels better

## 2016-04-11 NOTE — Telephone Encounter (Signed)
error 

## 2016-04-19 ENCOUNTER — Telehealth (INDEPENDENT_AMBULATORY_CARE_PROVIDER_SITE_OTHER): Payer: Self-pay | Admitting: Internal Medicine

## 2016-04-19 NOTE — Telephone Encounter (Signed)
Patient called at 3:43pm and stated that she did know that she was having a colonoscopy on 04/20/16 until she got a call from the hospital today.  She stated that she did not get an instruction letter.  I pulled it up and told her when it was mailed and confirmed her mailing address.  [name] cancelled her procedure for 04/20/16 and she will need to be reschedule.  Advised the patient that we did this and we'd call her to reschedule.  [phone]

## 2016-04-25 ENCOUNTER — Telehealth (INDEPENDENT_AMBULATORY_CARE_PROVIDER_SITE_OTHER): Payer: Self-pay | Admitting: *Deleted

## 2016-04-25 ENCOUNTER — Encounter (INDEPENDENT_AMBULATORY_CARE_PROVIDER_SITE_OTHER): Payer: Self-pay | Admitting: *Deleted

## 2016-04-25 MED ORDER — PEG 3350-KCL-NA BICARB-NACL 420 G PO SOLR: 4000.0000 mL | Freq: Once | ORAL | 0 refills | Status: AC

## 2016-04-25 NOTE — Telephone Encounter (Signed)
TCS resch'd to 05/26/16, patient aware, instructions mailed

## 2016-04-25 NOTE — Telephone Encounter (Signed)
Patient needs trilyte 

## 2016-04-26 ENCOUNTER — Encounter (INDEPENDENT_AMBULATORY_CARE_PROVIDER_SITE_OTHER): Payer: Self-pay | Admitting: *Deleted

## 2016-05-01 ENCOUNTER — Telehealth (INDEPENDENT_AMBULATORY_CARE_PROVIDER_SITE_OTHER): Payer: Self-pay | Admitting: *Deleted

## 2016-05-01 NOTE — Telephone Encounter (Addendum)
Dr Sudie BaileyKnowlton wants to talk to you about Kendra Ayala -- she has TCS sch'd 06/22/16  Cell# [phone] Ofc#: [phone]

## 2016-05-05 ENCOUNTER — Encounter (INDEPENDENT_AMBULATORY_CARE_PROVIDER_SITE_OTHER): Payer: Self-pay | Admitting: *Deleted

## 2016-05-08 NOTE — Telephone Encounter (Signed)
I talked with him over the weekend. Please schedule colonoscopy as soon as possible.

## 2016-05-09 NOTE — Telephone Encounter (Signed)
TCS sch'd 05/12/16, patient aware

## 2016-05-12 ENCOUNTER — Ambulatory Visit (HOSPITAL_COMMUNITY)
Admission: RE | Admit: 2016-05-12 | Discharge: 2016-05-12 | Disposition: A | Discharge status: Home / Self Care | Payer: Medicare HMO | Source: Ambulatory Visit | Attending: Internal Medicine | Admitting: Internal Medicine

## 2016-05-12 ENCOUNTER — Encounter (HOSPITAL_COMMUNITY)
Admission: RE | Disposition: A | Discharge status: Home / Self Care | Payer: Self-pay | Source: Ambulatory Visit | Attending: Internal Medicine

## 2016-05-12 ENCOUNTER — Encounter (HOSPITAL_COMMUNITY): Payer: Self-pay | Admitting: *Deleted

## 2016-05-12 ENCOUNTER — Other Ambulatory Visit (INDEPENDENT_AMBULATORY_CARE_PROVIDER_SITE_OTHER): Payer: Self-pay | Admitting: Internal Medicine

## 2016-05-12 DIAGNOSIS — K5730 Diverticulosis of large intestine without perforation or abscess without bleeding: Secondary | ICD-10-CM | POA: Insufficient documentation

## 2016-05-12 DIAGNOSIS — R197 Diarrhea, unspecified: Secondary | ICD-10-CM | POA: Insufficient documentation

## 2016-05-12 DIAGNOSIS — E079 Disorder of thyroid, unspecified: Secondary | ICD-10-CM | POA: Diagnosis not present

## 2016-05-12 DIAGNOSIS — Z79899 Other long term (current) drug therapy: Secondary | ICD-10-CM | POA: Insufficient documentation

## 2016-05-12 DIAGNOSIS — K644 Residual hemorrhoidal skin tags: Secondary | ICD-10-CM | POA: Diagnosis not present

## 2016-05-12 DIAGNOSIS — K449 Diaphragmatic hernia without obstruction or gangrene: Secondary | ICD-10-CM | POA: Insufficient documentation

## 2016-05-12 DIAGNOSIS — Z87891 Personal history of nicotine dependence: Secondary | ICD-10-CM | POA: Insufficient documentation

## 2016-05-12 DIAGNOSIS — R634 Abnormal weight loss: Secondary | ICD-10-CM | POA: Insufficient documentation

## 2016-05-12 DIAGNOSIS — D509 Iron deficiency anemia, unspecified: Secondary | ICD-10-CM

## 2016-05-12 DIAGNOSIS — K209 Esophagitis, unspecified: Secondary | ICD-10-CM | POA: Diagnosis not present

## 2016-05-12 DIAGNOSIS — R630 Anorexia: Secondary | ICD-10-CM | POA: Insufficient documentation

## 2016-05-12 DIAGNOSIS — K259 Gastric ulcer, unspecified as acute or chronic, without hemorrhage or perforation: Secondary | ICD-10-CM | POA: Insufficient documentation

## 2016-05-12 HISTORY — PX: COLONOSCOPY: SHX5424

## 2016-05-12 HISTORY — PX: ESOPHAGOGASTRODUODENOSCOPY: SHX5428

## 2016-05-12 SURGERY — COLONOSCOPY
Anesthesia: Moderate Sedation

## 2016-05-12 MED ORDER — SODIUM CHLORIDE 0.9 % IV SOLN
INTRAVENOUS | Status: DC
  Administered 2016-05-12: 1000 mL via INTRAVENOUS

## 2016-05-12 MED ORDER — SODIUM CHLORIDE 0.9% FLUSH
INTRAVENOUS | Status: AC
  Filled 2016-05-12: qty 10

## 2016-05-12 MED ORDER — BUTAMBEN-TETRACAINE-BENZOCAINE 2-2-14 % EX AERO
INHALATION_SPRAY | CUTANEOUS | Status: DC | PRN
  Administered 2016-05-12: 1 via TOPICAL

## 2016-05-12 MED ORDER — MEPERIDINE HCL 50 MG/ML IJ SOLN
INTRAMUSCULAR | Status: DC | PRN
  Administered 2016-05-12 (×2): 25 mg via INTRAVENOUS

## 2016-05-12 MED ORDER — STERILE WATER FOR IRRIGATION IR SOLN
Status: DC | PRN
  Administered 2016-05-12: 16:00:00

## 2016-05-12 MED ORDER — PANTOPRAZOLE SODIUM 40 MG PO TBEC: 40.0000 mg | DELAYED_RELEASE_TABLET | Freq: Every day | ORAL | 5 refills | Status: AC

## 2016-05-12 MED ORDER — FLINTSTONES PLUS IRON PO CHEW: 1.0000 | CHEWABLE_TABLET | Freq: Two times a day (BID) | ORAL | Status: AC

## 2016-05-12 MED ORDER — MIDAZOLAM HCL 5 MG/5ML IJ SOLN
INTRAMUSCULAR | Status: AC
  Filled 2016-05-12: qty 10

## 2016-05-12 MED ORDER — PROMETHAZINE HCL 25 MG/ML IJ SOLN
12.5000 mg | Freq: Four times a day (QID) | INTRAMUSCULAR | Status: DC | PRN
  Administered 2016-05-12: 12.5 mg via INTRAVENOUS

## 2016-05-12 MED ORDER — MIDAZOLAM HCL 5 MG/5ML IJ SOLN
INTRAMUSCULAR | Status: DC | PRN
  Administered 2016-05-12 (×3): 2 mg via INTRAVENOUS
  Administered 2016-05-12: 1 mg via INTRAVENOUS

## 2016-05-12 MED ORDER — MEPERIDINE HCL 50 MG/ML IJ SOLN
INTRAMUSCULAR | Status: AC
  Filled 2016-05-12: qty 1

## 2016-05-12 MED ORDER — PROMETHAZINE HCL 25 MG/ML IJ SOLN
INTRAMUSCULAR | Status: AC
  Filled 2016-05-12: qty 1

## 2016-05-12 NOTE — Op Note (Signed)
[facility] Patient Name: Kendra Ayala Procedure Date: 05/12/2016 4:17 PM MRN: [id number] Date of Birth: 29-Feb-1946 Attending MD: Lionel December , MD CSN: [id number] Age: 70 Admit Type: Outpatient Procedure:                Upper GI endoscopy Indications:              Iron deficiency anemia, Anorexia, Weight loss. No                            lesion found on colonoscopy. Providers:                Lionel December, MD, Brain Hilts RN, RN, Birder Robson, Technician Referring MD:             Katharine Look, MD Medicines:                Cetacaine spray, Midazolam 2 mg IV Complications:            No immediate complications. Estimated Blood Loss:     Estimated blood loss was minimal. Procedure:                Pre-Anesthesia Assessment:                           - Prior to the procedure, a History and Physical                            was performed, and patient medications and                            allergies were reviewed. The patient's tolerance of                            previous anesthesia was also reviewed. The risks                            and benefits of the procedure and the sedation                            options and risks were discussed with the patient.                            All questions were answered, and informed consent                            was obtained. Prior Anticoagulants: The patient has                            taken no previous anticoagulant or antiplatelet                            agents. ASA Grade Assessment: II - A patient with  mild systemic disease. After reviewing the risks                            and benefits, the patient was deemed in                            satisfactory condition to undergo the procedure.                           After obtaining informed consent, the endoscope was                            passed under direct vision. Throughout the                             procedure, the patient's blood pressure, pulse, and                            oxygen saturations were monitored continuously. The                            [id number] ([id number]) scope was introduced through the                            mouth, and advanced to the second part of duodenum.                            The upper GI endoscopy was accomplished without                            difficulty. Scope In: 4:21:17 PM Scope Out: 4:28:28 PM Total Procedure Duration: 0 hours 7 minutes 11 seconds  Findings:      The examined esophagus was normal.      Edema and erythema esophagitis was found at GEJ(39 cm).      A 2 cm hiatal hernia was present.      The entire examined stomach was normal except single erosion at gric       fundus.      The duodenal bulb and second portion of the duodenum were normal.       Biopsies for histology were taken with a cold forceps for evaluation of       celiac disease. Impression:               - Normal esophagus.                           - Mild changes of reflux esophagitis at                            gastroesophageal junction.                           - 2 cm hiatal hernia.                           - Single erosion  at gastric fundus otherwise normal                            stomach(? alendranote).                           - Normal duodenal bulb and second portion of the                            duodenum. Biopsied. Moderate Sedation:      Moderate (conscious) sedation was administered by the endoscopy nurse       and supervised by the endoscopist. The following parameters were       monitored: oxygen saturation, heart rate, blood pressure, CO2       capnography and response to care. Total physician intraservice time was       28 minutes. Recommendation:           - Patient has a contact number available for                            emergencies. The signs and symptoms of potential                            delayed complications were  discussed with the                            patient. Return to normal activities tomorrow.                            Written discharge instructions were provided to the                            patient.                           - Resume previous diet today.                           - Continue present medications.                           - Use Protonix (pantoprazole) 40 mg PO daily today.                           - flintstones with iron chewable 2 tablets y mouth                            daily. Procedure Code(s):        --- Professional ---                           [phone], Esophagogastroduodenoscopy, flexible,                            transoral; with biopsy, single or multiple  13086, Moderate sedation services provided by the                            same physician or other qualified health care                            professional performing the diagnostic or                            therapeutic service that the sedation supports,                            requiring the presence of an independent trained                            observer to assist in the monitoring of the                            patient's level of consciousness and physiological                            status; initial 15 minutes of intraservice time,                            patient age 52 years or older                           [phone], Moderate sedation services; each additional                            15 minutes intraservice time Diagnosis Code(s):        --- Professional ---                           K21.0, Gastro-esophageal reflux disease with                            esophagitis                           K44.9, Diaphragmatic hernia without obstruction or                            gangrene                           D50.9, Iron deficiency anemia, unspecified                           R63.0, Anorexia                           R63.4, Abnormal weight  loss CPT copyright 2016 [employer]. All rights reserved. The codes documented in this report are preliminary and upon coder review may  be revised to meet current compliance requirements. Lionel December, MD Lionel December, MD 05/12/2016 4:53:35 PM This report  has been signed electronically. Number of Addenda: 0

## 2016-05-12 NOTE — Discharge Instructions (Signed)
Resume usual medications and diet. Pantoprazole 40 mg by mouth 30 minutes before breakfast daily. Flintstones with iron chew 2 tablets daily. No driving for 24 hours. Physician will call with biopsy results.     Esophagogastroduodenoscopy, Care After Refer to this sheet in the next few weeks. These instructions provide you with information about caring for yourself after your procedure. Your health care provider may also give you more specific instructions. Your treatment has been planned according to current medical practices, but problems sometimes occur. Call your health care provider if you have any problems or questions after your procedure. WHAT TO EXPECT AFTER THE PROCEDURE After your procedure, it is typical to feel:  Soreness in your throat.  Pain with swallowing.  Sick to your stomach (nauseous).  Bloated.  Dizzy.  Fatigued. HOME CARE INSTRUCTIONS  Do not eat or drink anything until the numbing medicine (local anesthetic) has worn off and your gag reflex has returned. You will know that the local anesthetic has worn off when you can swallow comfortably.  Do not drive or operate machinery until directed by your health care provider.  Take medicines only as directed by your health care provider. SEEK MEDICAL CARE IF:   You cannot stop coughing.  You are not urinating at all or less than usual. SEEK IMMEDIATE MEDICAL CARE IF:  You have difficulty swallowing.  You cannot eat or drink.  You have worsening throat or chest pain.  You have dizziness or lightheadedness or you faint.  You have nausea or vomiting.  You have chills.  You have a fever.  You have severe abdominal pain.  You have black, tarry, or bloody stools.   This information is not intended to replace advice given to you by your health care provider. Make sure you discuss any questions you have with your health care provider.   Document Released: 08/21/2012 Document Revised: 09/25/2014  Document Reviewed: 08/21/2012 Elsevier Interactive Patient Education 2016 [employer].    Colonoscopy, Care After These instructions give you information on caring for yourself after your procedure. Your doctor may also give you more specific instructions. Call your doctor if you have any problems or questions after your procedure. HOME CARE  Do not drive for 24 hours.  Do not sign important papers or use machinery for 24 hours.  You may shower.  You may go back to your usual activities, but go slower for the first 24 hours.  Take rest breaks often during the first 24 hours.  Walk around or use warm packs on your belly (abdomen) if you have belly cramping or gas.  Drink enough fluids to keep your pee (urine) clear or pale yellow.  Resume your normal diet. Avoid heavy or fried foods.  Avoid drinking alcohol for 24 hours or as told by your doctor.  Only take medicines as told by your doctor. If a tissue sample (biopsy) was taken during the procedure:   Do not take aspirin or blood thinners for 7 days, or as told by your doctor.  Do not drink alcohol for 7 days, or as told by your doctor.  Eat soft foods for the first 24 hours. GET HELP IF: You still have a small amount of blood in your poop (stool) 2-3 days after the procedure. GET HELP RIGHT AWAY IF:  You have more than a small amount of blood in your poop.  You see clumps of tissue (blood clots) in your poop.  Your belly is puffy (swollen).  You feel sick  to your stomach (nauseous) or throw up (vomit).  You have a fever.  You have belly pain that gets worse and medicine does not help. MAKE SURE YOU:  Understand these instructions.  Will watch your condition.  Will get help right away if you are not doing well or get worse.   This information is not intended to replace advice given to you by your health care provider. Make sure you discuss any questions you have with your health care provider.   Document  Released: 10/07/2010 Document Revised: 09/09/2013 Document Reviewed: 05/12/2013 Elsevier Interactive Patient Education [employer]     Diverticulosis Diverticulosis is the condition that develops when small pouches (diverticula) form in the wall of your colon. Your colon, or large intestine, is where water is absorbed and stool is formed. The pouches form when the inside layer of your colon pushes through weak spots in the outer layers of your colon. CAUSES  No one knows exactly what causes diverticulosis. RISK FACTORS  Being older than 50. Your risk for this condition increases with age. Diverticulosis is rare in people younger than 40 years. By age 55, almost everyone has it.  Eating a low-fiber diet.  Being frequently constipated.  Being overweight.  Not getting enough exercise.  Smoking.  Taking over-the-counter pain medicines, like aspirin and ibuprofen. SYMPTOMS  Most people with diverticulosis do not have symptoms. DIAGNOSIS  Because diverticulosis often has no symptoms, health care providers often discover the condition during an exam for other colon problems. In many cases, a health care provider will diagnose diverticulosis while using a flexible scope to examine the colon (colonoscopy). TREATMENT  If you have never developed an infection related to diverticulosis, you may not need treatment. If you have had an infection before, treatment may include:  Eating more fruits, vegetables, and grains.  Taking a fiber supplement.  Taking a live bacteria supplement (probiotic).  Taking medicine to relax your colon. HOME CARE INSTRUCTIONS   Drink at least 6-8 glasses of water each day to prevent constipation.  Try not to strain when you have a bowel movement.  Keep all follow-up appointments. If you have had an infection before:  Increase the fiber in your diet as directed by your health care provider or dietitian.  Take a dietary fiber supplement if your  health care provider approves.  Only take medicines as directed by your health care provider. SEEK MEDICAL CARE IF:   You have abdominal pain.  You have bloating.  You have cramps.  You have not gone to the bathroom in 3 days. SEEK IMMEDIATE MEDICAL CARE IF:   Your pain gets worse.  Yourbloating becomes very bad.  You have a fever or chills, and your symptoms suddenly get worse.  You begin vomiting.  You have bowel movements that are bloody or black. MAKE SURE YOU:  Understand these instructions.  Will watch your condition.  Will get help right away if you are not doing well or get worse.   This information is not intended to replace advice given to you by your health care provider. Make sure you discuss any questions you have with your health care provider.   Document Released: 06/01/2004 Document Revised: 09/09/2013 Document Reviewed: 07/30/2013 Elsevier Interactive Patient Education 2016 [employer].  Hemorrhoids Hemorrhoids are swollen veins around the rectum or anus. There are two types of hemorrhoids:   Internal hemorrhoids. These occur in the veins just inside the rectum. They may poke through to the outside and become  irritated and painful.  External hemorrhoids. These occur in the veins outside the anus and can be felt as a painful swelling or hard lump near the anus. CAUSES  Pregnancy.   Obesity.   Constipation or diarrhea.   Straining to have a bowel movement.   Sitting for long periods on the toilet.  Heavy lifting or other activity that caused you to strain.  Anal intercourse. SYMPTOMS   Pain.   Anal itching or irritation.   Rectal bleeding.   Fecal leakage.   Anal swelling.   One or more lumps around the anus.  DIAGNOSIS  Your caregiver may be able to diagnose hemorrhoids by visual examination. Other examinations or tests that may be performed include:   Examination of the rectal area with a gloved hand (digital  rectal exam).   Examination of anal canal using a small tube (scope).   A blood test if you have lost a significant amount of blood.  A test to look inside the colon (sigmoidoscopy or colonoscopy). TREATMENT Most hemorrhoids can be treated at home. However, if symptoms do not seem to be getting better or if you have a lot of rectal bleeding, your caregiver may perform a procedure to help make the hemorrhoids get smaller or remove them completely. Possible treatments include:   Placing a rubber band at the base of the hemorrhoid to cut off the circulation (rubber band ligation).   Injecting a chemical to shrink the hemorrhoid (sclerotherapy).   Using a tool to burn the hemorrhoid (infrared light therapy).   Surgically removing the hemorrhoid (hemorrhoidectomy).   Stapling the hemorrhoid to block blood flow to the tissue (hemorrhoid stapling).  HOME CARE INSTRUCTIONS   Eat foods with fiber, such as whole grains, beans, nuts, fruits, and vegetables. Ask your doctor about taking products with added fiber in them (fibersupplements).  Increase fluid intake. Drink enough water and fluids to keep your urine clear or pale yellow.   Exercise regularly.   Go to the bathroom when you have the urge to have a bowel movement. Do not wait.   Avoid straining to have bowel movements.   Keep the anal area dry and clean. Use wet toilet paper or moist towelettes after a bowel movement.   Medicated creams and suppositories may be used or applied as directed.   Only take over-the-counter or prescription medicines as directed by your caregiver.   Take warm sitz baths for 15-20 minutes, 3-4 times a day to ease pain and discomfort.   Place ice packs on the hemorrhoids if they are tender and swollen. Using ice packs between sitz baths may be helpful.   Put ice in a plastic bag.   Place a towel between your skin and the bag.   Leave the ice on for 15-20 minutes, 3-4 times a day.    Do not use a donut-shaped pillow or sit on the toilet for long periods. This increases blood pooling and pain.  SEEK MEDICAL CARE IF:  You have increasing pain and swelling that is not controlled by treatment or medicine.  You have uncontrolled bleeding.  You have difficulty or you are unable to have a bowel movement.  You have pain or inflammation outside the area of the hemorrhoids. MAKE SURE YOU:  Understand these instructions.  Will watch your condition.  Will get help right away if you are not doing well or get worse.   This information is not intended to replace advice given to you by your health care  provider. Make sure you discuss any questions you have with your health care provider.   Document Released: 09/01/2000 Document Revised: 08/21/2012 Document Reviewed: 07/09/2012 Elsevier Interactive Patient Education [employer] Inc[employer]

## 2016-05-12 NOTE — H&P (Signed)
Kendra BambergerBrenda L Ayala is an 70 y.o. female.   Chief Complaint: Patient is here for colonoscopy and possible EGD. HPI: Patient is 70 year old African-American female was diagnosed with iron deficiency anemia by 2 months ago. She also gives history of diarrhea. She has one to 2 stools per day very loose. GI pathogen panel was positive for Salmonella by PCR. She denies melena or rectal bleeding nausea vomiting or hematemesis. Her stool was guaiac negative in the office. She has poor appetite and she states she's been gradually losing weight. She she had been poisoned. Patient had been on Naprosyn until about 8 weeks ago. She says she's been or Naprosyn for 2-3 years. Last colonoscopy was in April 2010 with removal of single small polyp and was tubular adenoma. Family History is negative for CRC.  Past Medical History:  Diagnosis Date  . IDA (iron deficiency anemia)   . Thyroid disease     Past Surgical History:  Procedure Laterality Date  . ABDOMINAL HYSTERECTOMY      History reviewed. No pertinent family history. Social History:  reports that she has quit smoking. She has never used smokeless tobacco. She reports that she drinks alcohol. She reports that she does not use drugs.  Allergies:  Allergies  Allergen Reactions  . Asa [Aspirin] Other (See Comments)    "stomach burns"    Medications Prior to Admission  Medication Sig Dispense Refill  . azithromycin (ZITHROMAX) 500 MG tablet Take 1 tablet (500 mg total) by mouth daily. 7 tablet 0  . diazepam (VALIUM) 5 MG tablet Take 5 mg by mouth 2 (two) times daily as needed for anxiety.     Marland Kitchen. FLUoxetine (PROZAC) 20 MG tablet Take 20 mg by mouth 4 (four) times daily.     Marland Kitchen. HYDROcodone-acetaminophen (NORCO) 7.5-325 MG tablet Take 1 tablet by mouth every 6 (six) hours as needed for moderate pain.    Marland Kitchen. levothyroxine (SYNTHROID, LEVOTHROID) 125 MCG tablet Take 62.5 mcg by mouth daily before breakfast.     . polyethylene glycol-electrolytes (TRILYTE) 420  g solution Take 4,000 mLs by mouth once. 4000 mL 0  . zolpidem (AMBIEN) 10 MG tablet Take 10 mg by mouth at bedtime as needed for sleep.    Marland Kitchen. acetaminophen (TYLENOL) 325 MG tablet Take 650 mg by mouth every 6 (six) hours as needed for pain.    Marland Kitchen. alendronate (FOSAMAX) 70 MG tablet Take 70 mg by mouth every 7 (seven) days. Take on Friday. Take with a full glass of water on an empty stomach.    Marland Kitchen. atorvastatin (LIPITOR) 20 MG tablet Take 20 mg by mouth daily.    . ondansetron (ZOFRAN ODT) 4 MG disintegrating tablet Take 1 tablet (4 mg total) by mouth every 8 (eight) hours as needed. 10 tablet 1    No results found for this or any previous visit (from the past 48 hour(s)). No results found.  ROS  Blood pressure 131/60, pulse 73, temperature 98.6 F (37 C), temperature source Oral, resp. rate 14, height 5\' 7"  (1.702 m), weight 130 lb (59 kg), SpO2 100 %. Physical Exam  Constitutional:  Well-developed thin African-American female in NAD.  HENT:  Mouth/Throat: Oropharynx is clear and moist.  Eyes: Conjunctivae are normal. No scleral icterus.  Neck: No thyromegaly present.  Cardiovascular: Normal rate, regular rhythm and normal heart sounds.   No murmur heard. Respiratory: Effort normal and breath sounds normal.  GI:  Abdomen is flat soft and nontender without organomegaly or masses.  Musculoskeletal: She  exhibits no edema.  Lymphadenopathy:    She has no cervical adenopathy.  Neurological: She is alert.  Skin: Skin is warm and dry.     Assessment/Plan Iron deficiency anemia. Anorexia and weight loss. Diagnostic colonoscopy followed by EGD if colonoscopy is negative.  Lionel December, MD 05/12/2016, 3:25 PM

## 2016-05-12 NOTE — Op Note (Signed)
[facility] Patient Name: Kendra Ayala Procedure Date: 05/12/2016 3:16 PM MRN: [id number] Date of Birth: Mar 25, 1946 Attending MD: Lionel December , MD CSN: [id number] Age: 70 Admit Type: Outpatient Procedure:                Colonoscopy Indications:              Iron deficiency anemia Providers:                Lionel December, MD, Loma Messing B. Patsy Lager, RN, Birder Robson, Technician Referring MD:             Katharine Look, MD Medicines:                Meperidine 50 mg IV, Midazolam 5 mg IV Complications:            No immediate complications. Estimated Blood Loss:     Estimated blood loss: none. Procedure:                Pre-Anesthesia Assessment:                           - Prior to the procedure, a History and Physical                            was performed, and patient medications and                            allergies were reviewed. The patient's tolerance of                            previous anesthesia was also reviewed. The risks                            and benefits of the procedure and the sedation                            options and risks were discussed with the patient.                            All questions were answered, and informed consent                            was obtained. Prior Anticoagulants: The patient has                            taken no previous anticoagulant or antiplatelet                            agents. ASA Grade Assessment: II - A patient with                            mild systemic disease. After reviewing the risks  and benefits, the patient was deemed in                            satisfactory condition to undergo the procedure.                           After obtaining informed consent, the colonoscope                            was passed under direct vision. Throughout the                            procedure, the patient's blood pressure, pulse, and    oxygen saturations were monitored continuously. The                            [id number] ([id number]) scope was introduced through                            the anus and advanced to the the anus. The                            colonoscopy was performed without difficulty. The                            patient tolerated the procedure well. The quality                            of the bowel preparation was adequate. The                            ileocecal valve, appendiceal orifice, and rectum                            were photographed. Scope In: 3:38:58 PM Scope Out: 3:53:28 PM Scope Withdrawal Time: 0 hours 7 minutes 51 seconds  Total Procedure Duration: 0 hours 14 minutes 30 seconds  Findings:      Scattered medium-mouthed diverticula were found in the hepatic flexure       and ascending colon.      The exam was otherwise normal throughout the examined colon.      External hemorrhoids were found during retroflexion. The hemorrhoids       were small. Impression:               - Diverticulosis at the hepatic flexure and in the                            ascending colon.                           - External hemorrhoids.                           - No specimens collected. Moderate Sedation:      Moderate (conscious) sedation was administered by the endoscopy nurse  and supervised by the endoscopist. The following parameters were       monitored: oxygen saturation, heart rate, blood pressure, CO2       capnography and response to care. Total physician intraservice time was       21 minutes. Recommendation:           - Patient has a contact number available for                            emergencies. The signs and symptoms of potential                            delayed complications were discussed with the                            patient. Return to normal activities tomorrow.                            Written discharge instructions were provided to the                             patient.                           - Resume previous diet today.                           - Continue present medications.                           - Repeat colonoscopy in 10 years for screening                            purposes. Procedure Code(s):        --- Professional ---                           [phone], 53, Colonoscopy, flexible; diagnostic,                            including collection of specimen(s) by brushing or                            washing, when performed (separate procedure)                           99152, Moderate sedation services provided by the                            same physician or other qualified health care                            professional performing the diagnostic or                            therapeutic service that the sedation supports,  requiring the presence of an independent trained                            observer to assist in the monitoring of the                            patient's level of consciousness and physiological                            status; initial 15 minutes of intraservice time,                            patient age 11 years or older Diagnosis Code(s):        --- Professional ---                           K64.4, Residual hemorrhoidal skin tags                           D50.9, Iron deficiency anemia, unspecified                           K57.30, Diverticulosis of large intestine without                            perforation or abscess without bleeding CPT copyright 2016 American [employer] Association. All rights reserved. The codes documented in this report are preliminary and upon coder review may  be revised to meet current compliance requirements. Lionel DecemberNajeeb Tongela Encinas, MD Lionel DecemberNajeeb Bernell Sigal, MD 05/12/2016 4:05:26 PM This report has been signed electronically. Number of Addenda: 0

## 2016-05-13 LAB — GASTROINTESTINAL PANEL BY PCR, STOOL (REPLACES STOOL CULTURE)
ADENOVIRUS F40/41: NOT DETECTED
ASTROVIRUS: NOT DETECTED
CYCLOSPORA CAYETANENSIS: NOT DETECTED
Campylobacter species: NOT DETECTED
Cryptosporidium: NOT DETECTED
E. coli O157: NOT DETECTED
ENTAMOEBA HISTOLYTICA: NOT DETECTED
ENTEROPATHOGENIC E COLI (EPEC): NOT DETECTED
ENTEROTOXIGENIC E COLI (ETEC): NOT DETECTED
Enteroaggregative E coli (EAEC): NOT DETECTED
Giardia lamblia: NOT DETECTED
Norovirus GI/GII: NOT DETECTED
Plesimonas shigelloides: NOT DETECTED
Rotavirus A: NOT DETECTED
Salmonella species: NOT DETECTED
Sapovirus (I, II, IV, and V): NOT DETECTED
Shiga like toxin producing E coli (STEC): NOT DETECTED
Shigella/Enteroinvasive E coli (EIEC): NOT DETECTED
VIBRIO CHOLERAE: NOT DETECTED
VIBRIO SPECIES: NOT DETECTED
Yersinia enterocolitica: NOT DETECTED

## 2016-05-18 ENCOUNTER — Encounter (HOSPITAL_COMMUNITY): Payer: Self-pay | Admitting: Internal Medicine

## 2016-07-14 ENCOUNTER — Other Ambulatory Visit (HOSPITAL_COMMUNITY): Payer: Self-pay | Admitting: Family Medicine

## 2016-07-14 DIAGNOSIS — Z1231 Encounter for screening mammogram for malignant neoplasm of breast: Secondary | ICD-10-CM

## 2016-07-28 ENCOUNTER — Ambulatory Visit (HOSPITAL_COMMUNITY)
Admission: RE | Admit: 2016-07-28 | Discharge: 2016-07-28 | Disposition: A | Discharge status: Home / Self Care | Payer: Medicare HMO | Source: Ambulatory Visit | Attending: Family Medicine | Admitting: Family Medicine

## 2016-07-28 DIAGNOSIS — Z1231 Encounter for screening mammogram for malignant neoplasm of breast: Secondary | ICD-10-CM | POA: Insufficient documentation

## 2016-09-26 ENCOUNTER — Other Ambulatory Visit (HOSPITAL_COMMUNITY): Payer: Self-pay | Admitting: Family Medicine

## 2016-09-26 ENCOUNTER — Ambulatory Visit (HOSPITAL_COMMUNITY)
Admission: RE | Admit: 2016-09-26 | Discharge: 2016-09-26 | Disposition: A | Discharge status: Home / Self Care | Payer: Medicare HMO | Source: Ambulatory Visit | Attending: Family Medicine | Admitting: Family Medicine

## 2016-09-26 DIAGNOSIS — M79642 Pain in left hand: Secondary | ICD-10-CM | POA: Insufficient documentation

## 2016-09-26 DIAGNOSIS — M25532 Pain in left wrist: Secondary | ICD-10-CM

## 2016-12-04 ENCOUNTER — Other Ambulatory Visit (HOSPITAL_COMMUNITY): Payer: Self-pay | Admitting: Family Medicine

## 2016-12-04 ENCOUNTER — Ambulatory Visit (HOSPITAL_COMMUNITY)
Admission: RE | Admit: 2016-12-04 | Discharge: 2016-12-04 | Disposition: A | Discharge status: Home / Self Care | Payer: Medicare HMO | Source: Ambulatory Visit | Attending: Family Medicine | Admitting: Family Medicine

## 2016-12-04 DIAGNOSIS — R102 Pelvic and perineal pain: Secondary | ICD-10-CM | POA: Diagnosis present

## 2016-12-04 DIAGNOSIS — R52 Pain, unspecified: Secondary | ICD-10-CM

## 2017-05-30 ENCOUNTER — Other Ambulatory Visit (HOSPITAL_COMMUNITY): Payer: Self-pay | Admitting: Family Medicine

## 2017-05-30 ENCOUNTER — Ambulatory Visit (HOSPITAL_COMMUNITY)
Admission: RE | Admit: 2017-05-30 | Discharge: 2017-05-30 | Disposition: A | Discharge status: Home / Self Care | Payer: Medicare HMO | Source: Ambulatory Visit | Attending: Family Medicine | Admitting: Family Medicine

## 2017-05-30 DIAGNOSIS — M130 Polyarthritis, unspecified: Secondary | ICD-10-CM

## 2017-05-30 DIAGNOSIS — M859 Disorder of bone density and structure, unspecified: Secondary | ICD-10-CM

## 2017-06-01 ENCOUNTER — Other Ambulatory Visit (HOSPITAL_COMMUNITY): Payer: Medicare HMO

## 2017-06-04 ENCOUNTER — Encounter (HOSPITAL_COMMUNITY): Payer: Self-pay

## 2017-06-04 ENCOUNTER — Other Ambulatory Visit (HOSPITAL_COMMUNITY): Payer: Medicare HMO

## 2017-06-08 ENCOUNTER — Ambulatory Visit (HOSPITAL_COMMUNITY)
Admission: RE | Admit: 2017-06-08 | Discharge: 2017-06-08 | Disposition: A | Discharge status: Home / Self Care | Payer: Medicare HMO | Source: Ambulatory Visit | Attending: Family Medicine | Admitting: Family Medicine

## 2017-06-08 DIAGNOSIS — M85851 Other specified disorders of bone density and structure, right thigh: Secondary | ICD-10-CM | POA: Insufficient documentation

## 2017-06-08 DIAGNOSIS — M8580 Other specified disorders of bone density and structure, unspecified site: Secondary | ICD-10-CM | POA: Diagnosis present

## 2017-06-08 DIAGNOSIS — M859 Disorder of bone density and structure, unspecified: Secondary | ICD-10-CM

## 2017-06-21 IMAGING — CT CT ABD-PELV W/ CM
2 of 5 series · 15 of 46 positions shown, 17 images · IV contrast (iopamidol)
Comparison: None.

CLINICAL DATA: Abdominal pain, diarrhea since [DATE]

EXAM:
CT ABDOMEN AND PELVIS WITH CONTRAST
TECHNIQUE: Multidetector CT imaging of the abdomen and pelvis was performed
using the standard protocol following bolus administration of
intravenous contrast.
CONTRAST:  100mL I1A62X-NEE IOPAMIDOL (I1A62X-NEE) INJECTION 61%

[Series 2: routine abd pel with · axial · 0.64mm/px · z∈[-416,-1]mm · 12 of 95 slices shown, 14 images]
[im 6/95  soft-tissue]
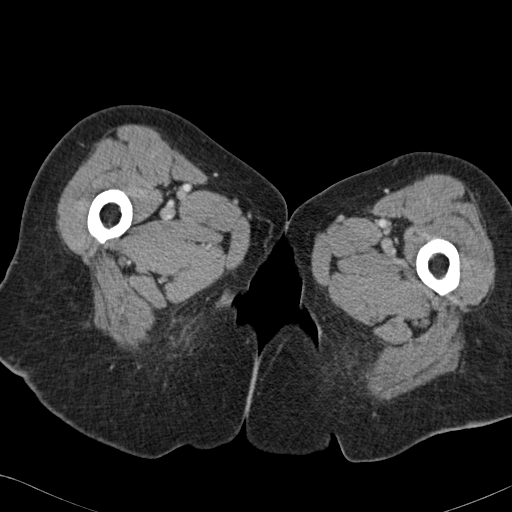
[im 6/95  bone]
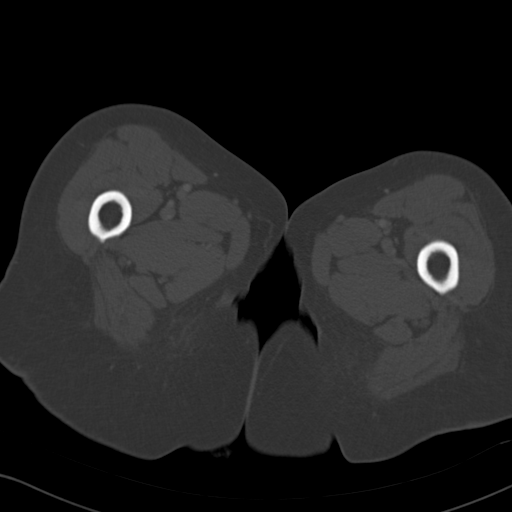
[im 16/95  soft-tissue]
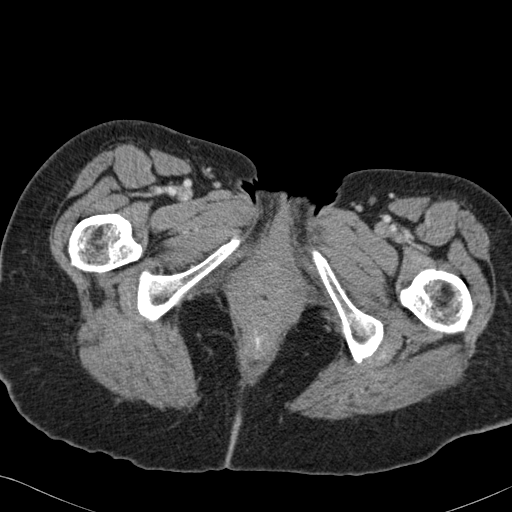
[im 21/95  soft-tissue]
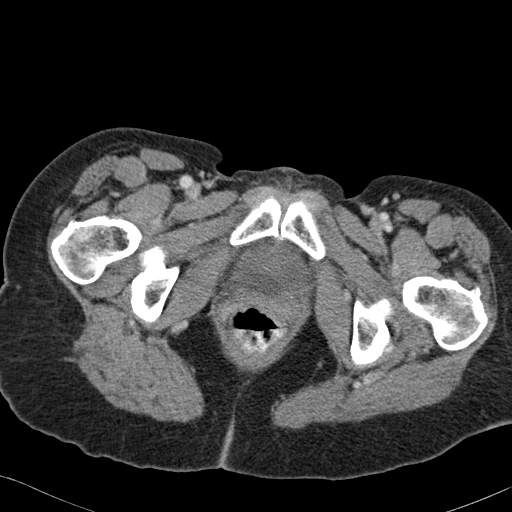
[im 27/95  soft-tissue]
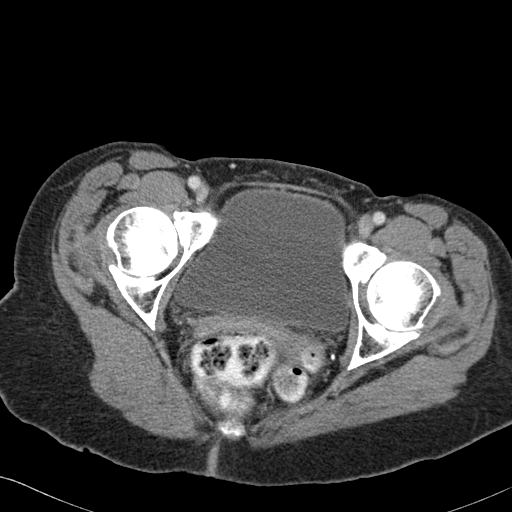
[im 37/95  soft-tissue]
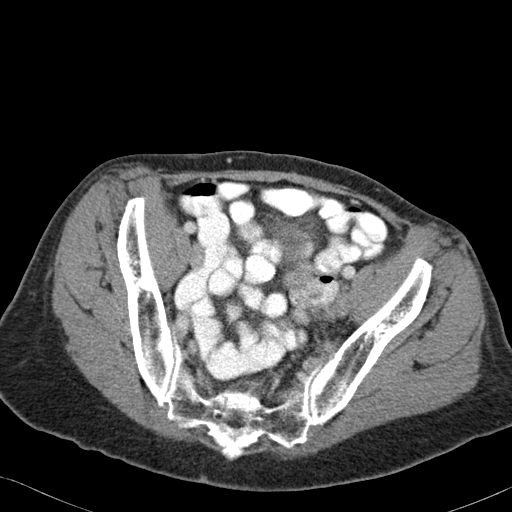
[im 42/95  soft-tissue]
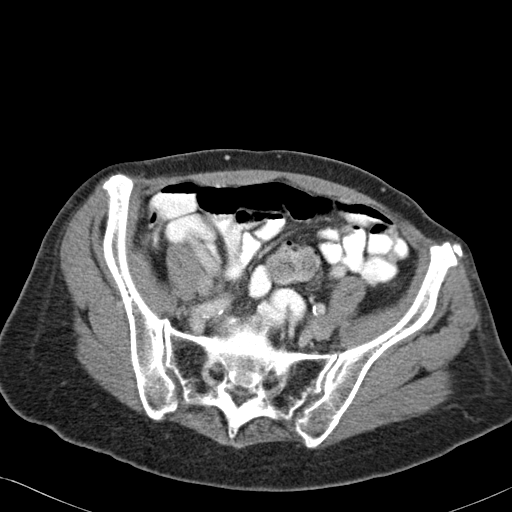
[im 53/95  soft-tissue]
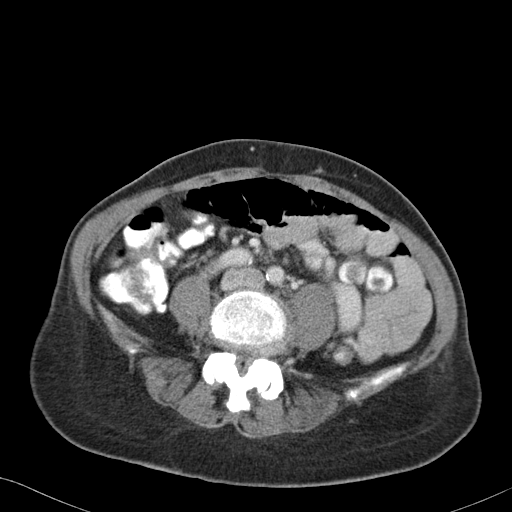
[im 58/95  soft-tissue]
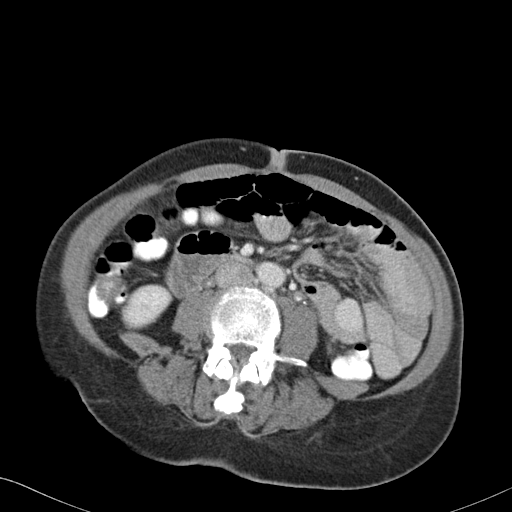
[im 68/95  soft-tissue]
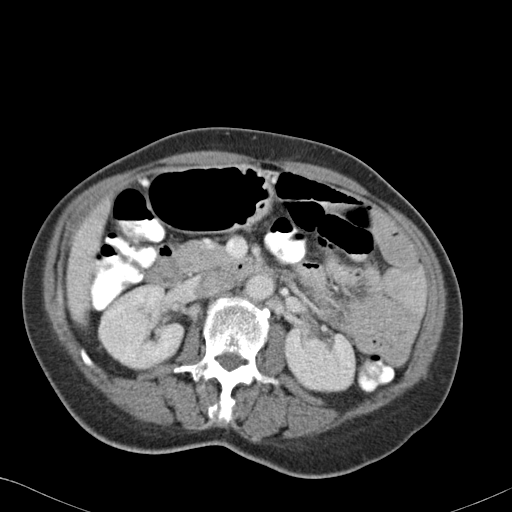
[im 68/95  bone]
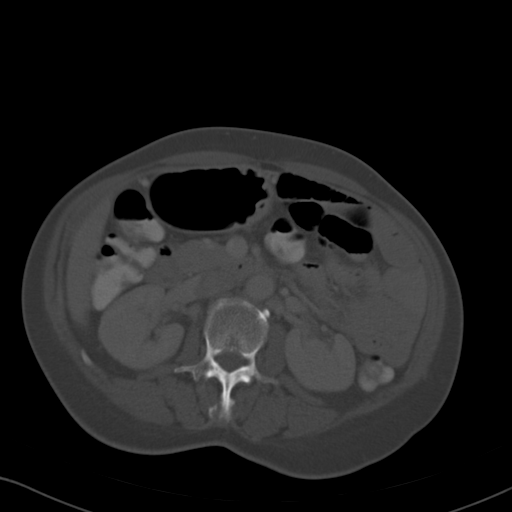
[im 74/95  soft-tissue]
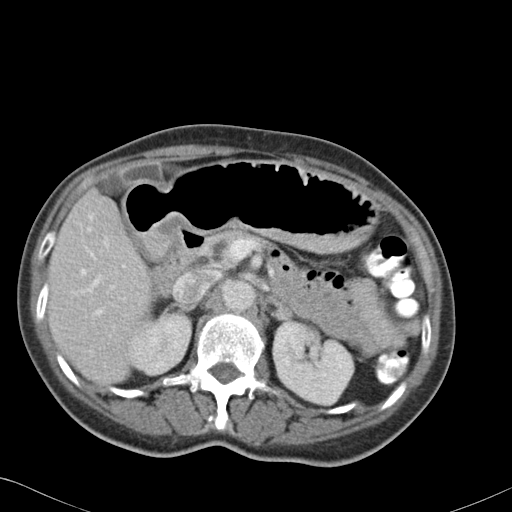
[im 79/95  soft-tissue]
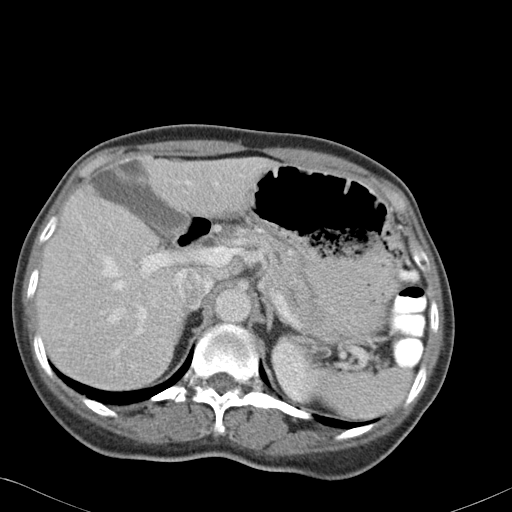
[im 89/95  soft-tissue]
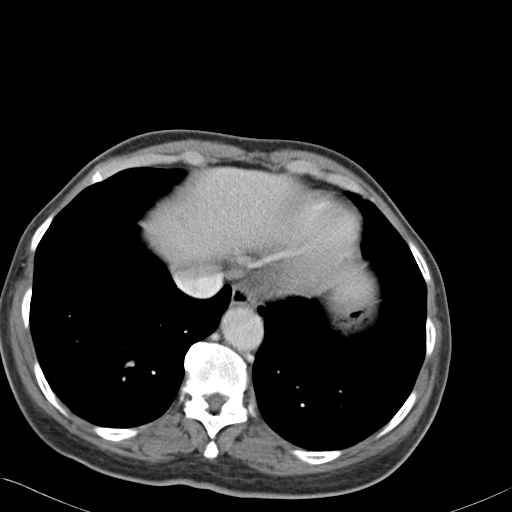

[Series 3: coronal · coronal · 0.67mm/px · 3 of 126 slices shown]
[im 42/126  soft-tissue]
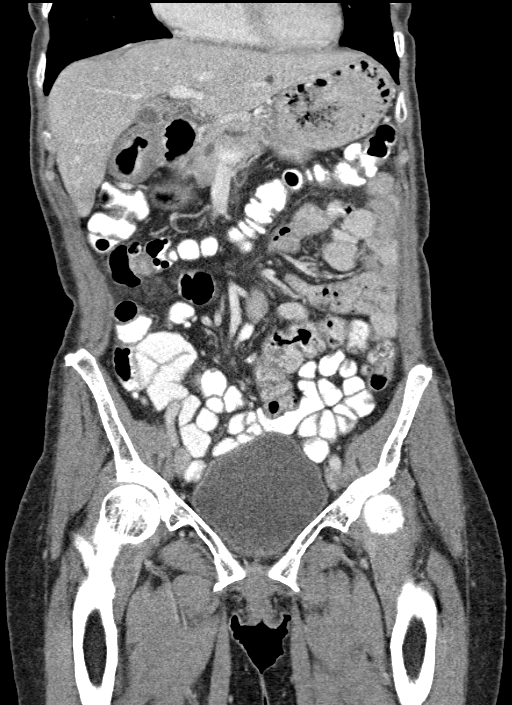
[im 56/126  soft-tissue]
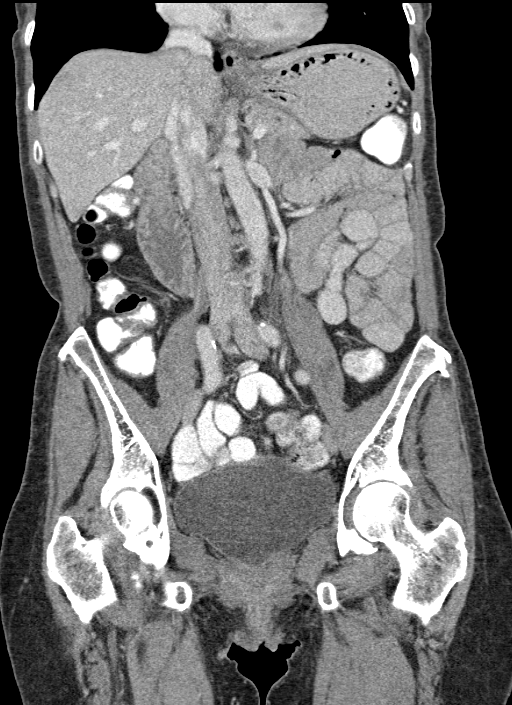
[im 70/126  soft-tissue]
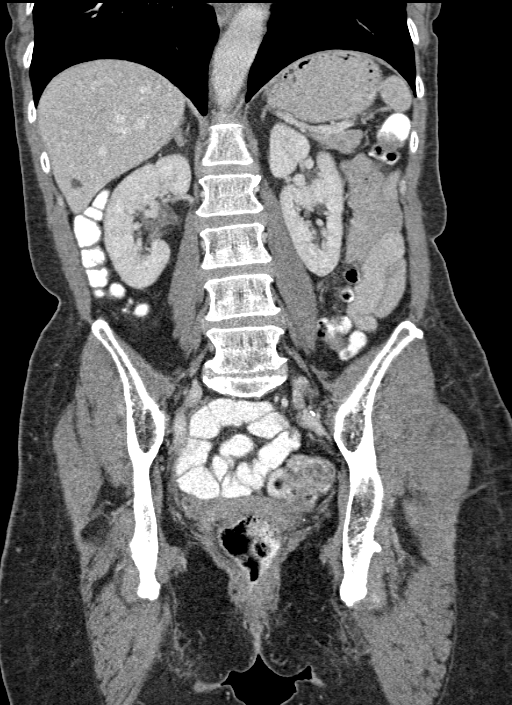

[15 of 46 positions shown; findings below may reference images not displayed]

FINDINGS: Lower chest:  Lung bases are unremarkable.

Hepatobiliary: Enhanced liver shows no biliary ductal dilatation. No
calcified gallstones are noted within gallbladder. At least 3
hepatic cysts are noted. The largest inferior aspect of the right
hepatic lobe measures 1 cm. A cyst in left hepatic lobe measures 1
cm. A smaller cyst in left hepatic lobe measures 5 mm.

Pancreas: No focal pancreatic mass. There is minimal dilatation of
main pancreatic duct up to 4 mm within the pancreatic head and body.
No evidence of peripancreatic inflammation.

Spleen: Enhanced spleen is unremarkable.

Adrenals/Urinary Tract: No adrenal gland mass is noted. Enhanced
kidneys are symmetrical in size. No hydronephrosis or hydroureter.
Delayed renal images shows bilateral renal symmetrical excretion.
Bilateral visualized proximal ureter is unremarkable. The urinary
bladder is unremarkable.

Stomach/Bowel: There is no small bowel obstruction. No thickened or
dilated small bowel loops. Contrast material noted throughout the
colon. No evidence of colonic obstruction. No pericecal
inflammation. Normal appendix is partially visualized in axial image
51. The terminal ileum is unremarkable. There is no evidence of
diverticulitis or colitis. Some colonic stool noted in sigmoid colon
and rectum.

Vascular/Lymphatic: Mild atherosclerotic calcifications of abdominal
aorta and iliac arteries. No aortic aneurysm. No retroperitoneal or
mesenteric adenopathy.

Reproductive: The patient is status post hysterectomy. No pelvic
mass is noted.

Other: No ascites or free abdominal air.

Musculoskeletal: No destructive bony lesions are noted. There is
significant disc space flattening with mild anterior and mild
posterior spurring at L5-S1 level. Mild spinal canal stenosis at
L5-S1 level.
IMPRESSION: 1. There is no evidence of acute inflammatory process within abdomen
or pelvis.
2. Few hepatic cysts are noted the largest measures 1 cm. No
intrahepatic biliary ductal dilatation.
3. Mild dilatation of main pancreatic duct up to 4 mm. There is no
evidence of peripancreatic inflammation or pancreatic mass.
4. No small bowel obstruction.
5. No pericecal inflammation.  Normal appendix partially visualized.
6. No evidence of colitis or diverticulitis. No colonic obstruction.
7. Degenerative changes lumbar spine at L5-S1 level.

## 2017-06-21 IMAGING — DX DG CHEST 2V
2 series · 2 of 2 positions shown · non-contrast
Comparison: None.

CLINICAL DATA: Productive cough, congestion and shortness of
breath.

EXAM:
CHEST  2 VIEW

[chest pa]
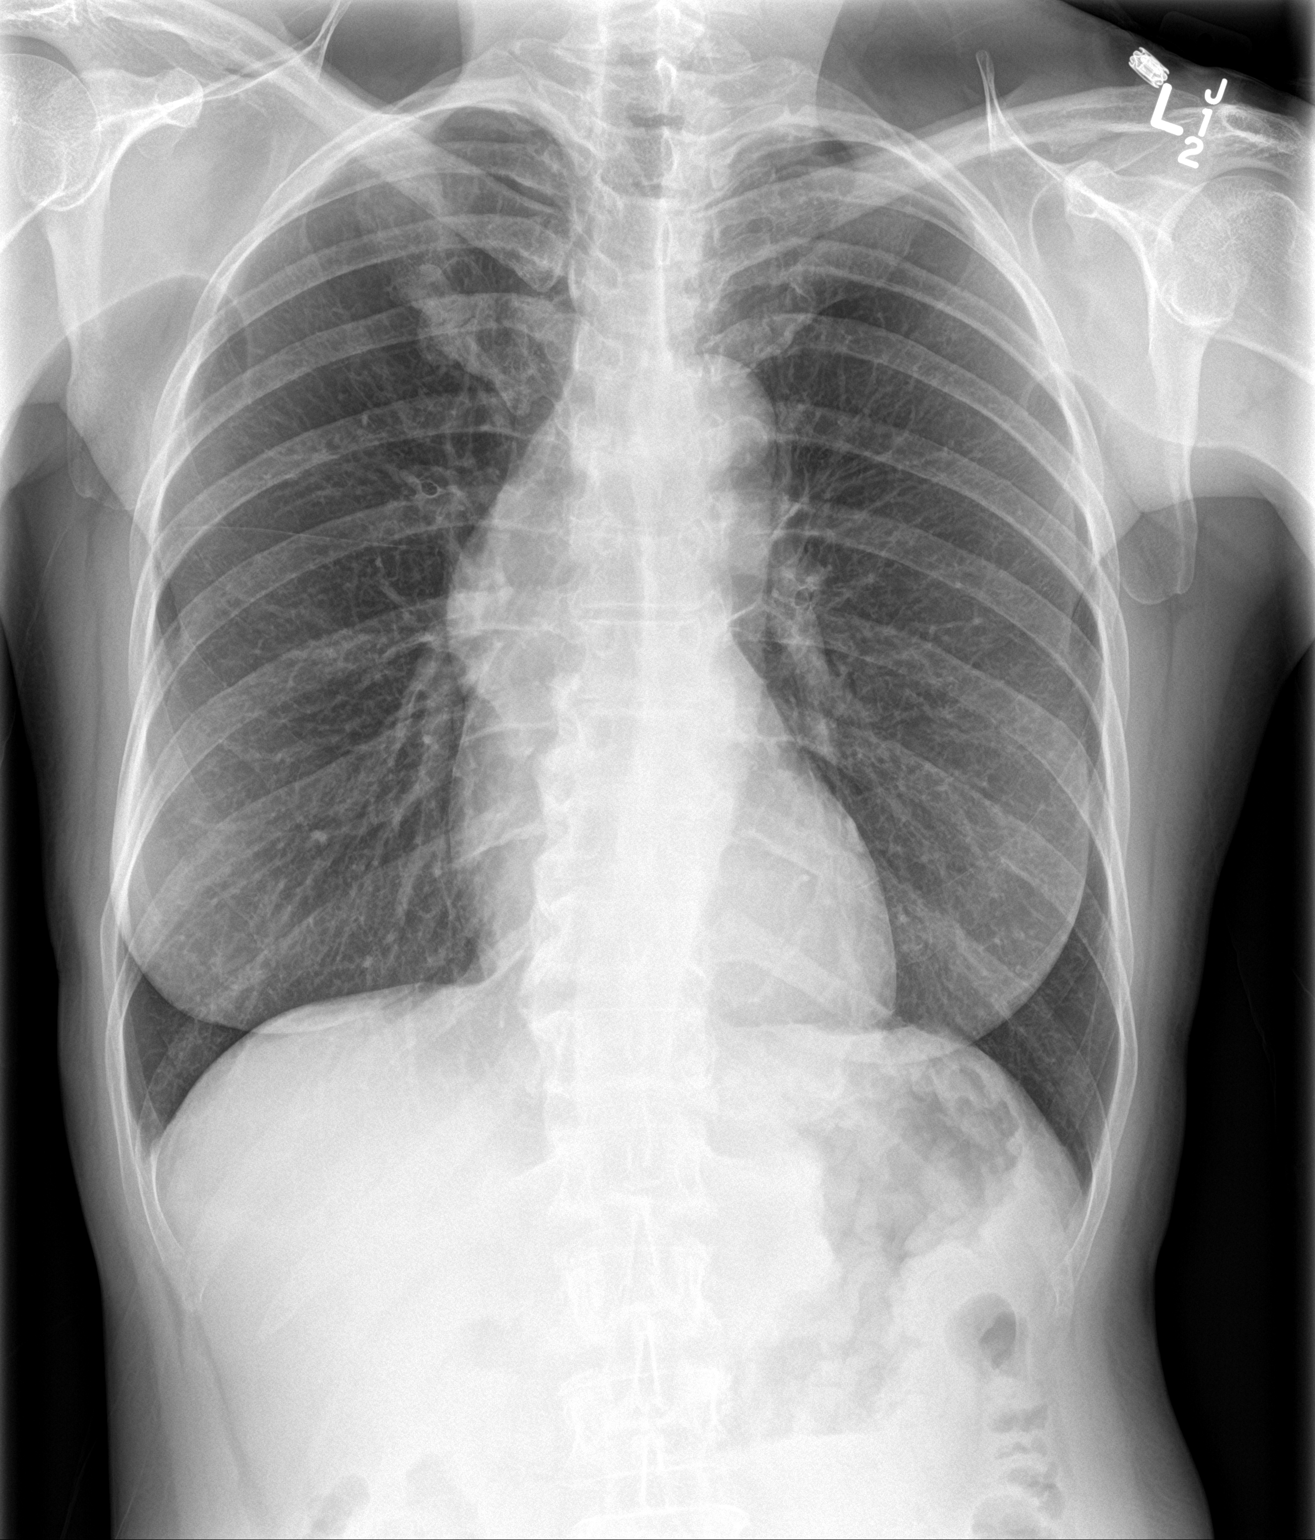

[chest lat]
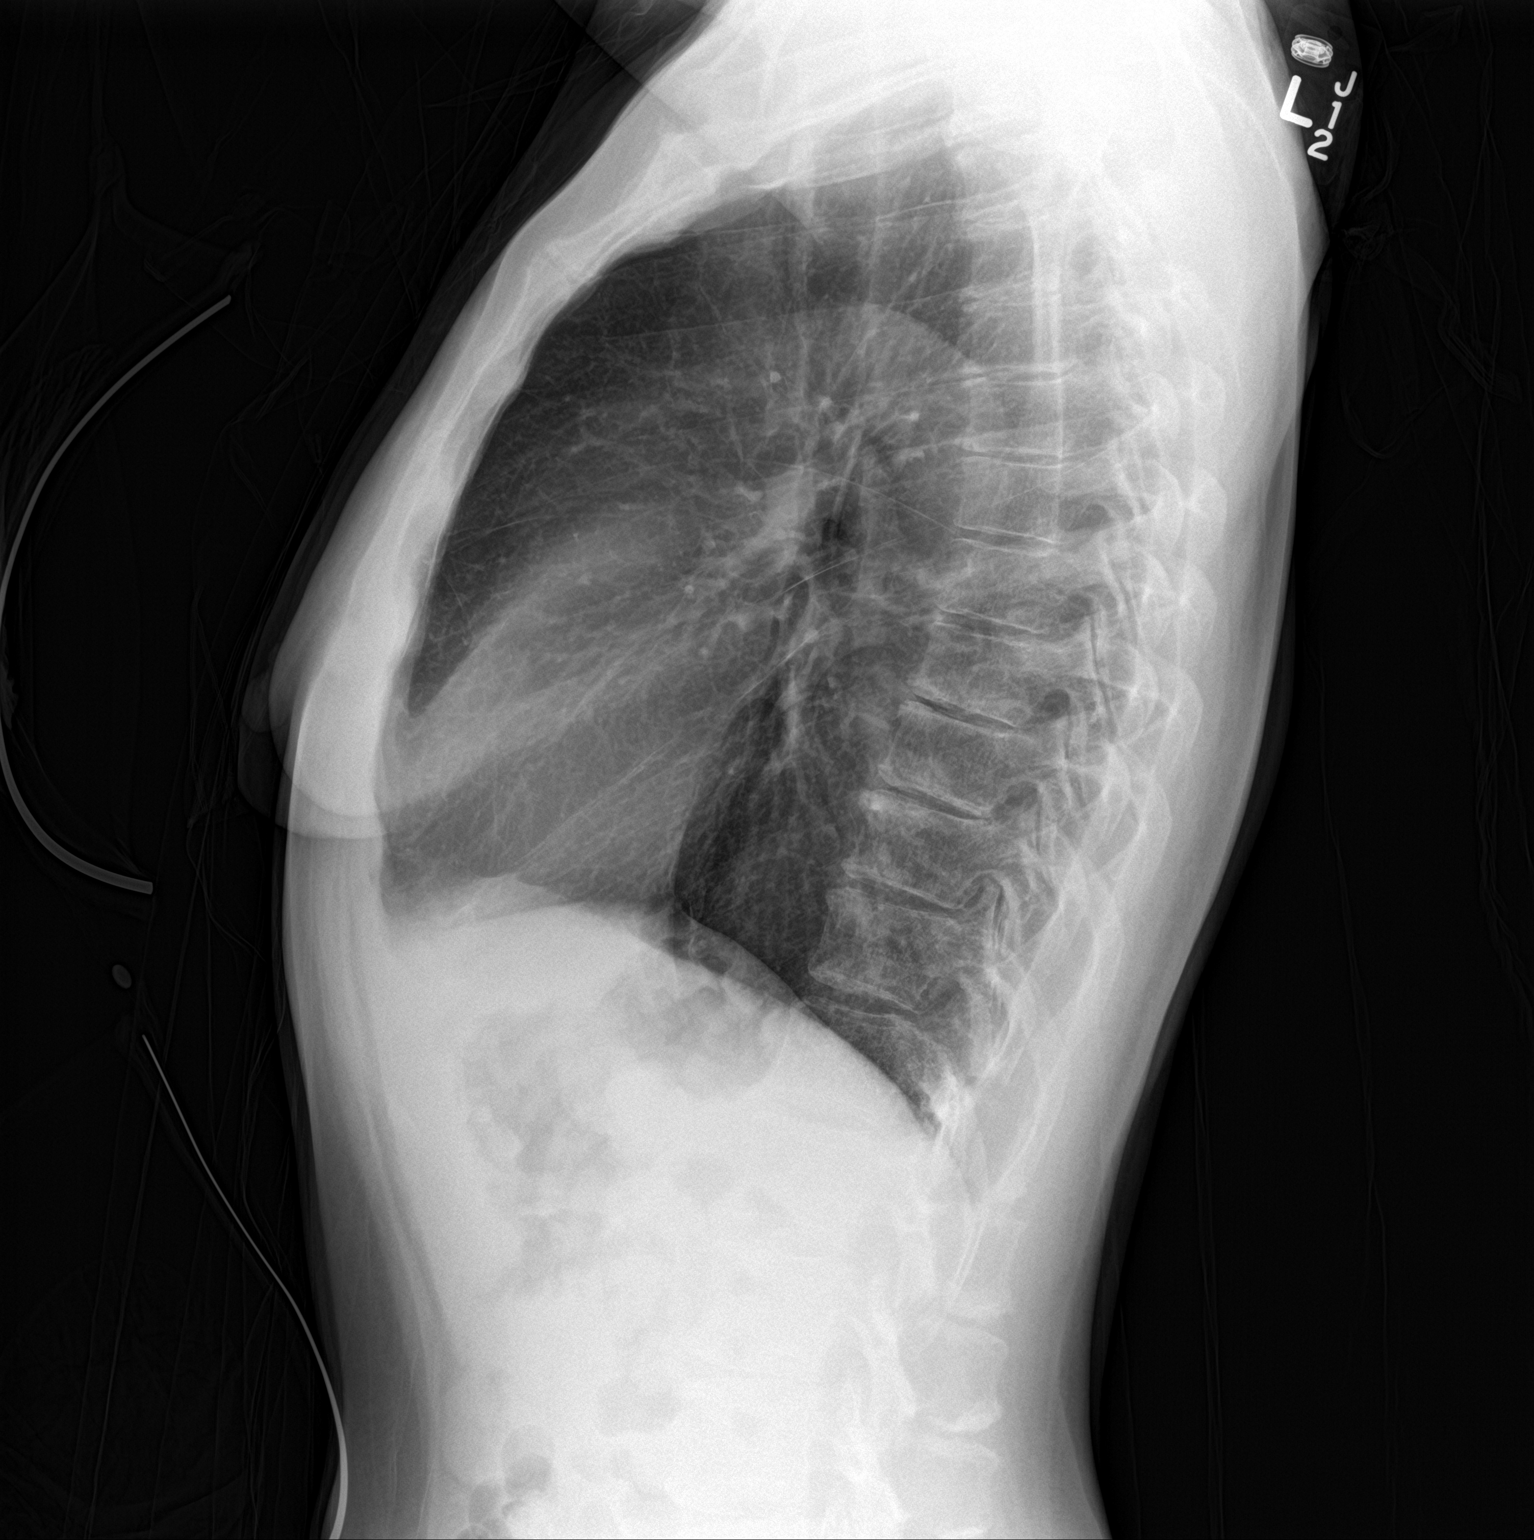

[2 of 2 positions shown; findings below may reference images not displayed]

FINDINGS: Cardiac silhouette is normal in size. Aorta is mildly uncoiled. No
mediastinal or hilar masses or evidence of adenopathy.

Clear lungs.  No pleural effusion or pneumothorax.

Bony thorax is intact.
IMPRESSION: No active cardiopulmonary disease.

## 2017-06-27 ENCOUNTER — Other Ambulatory Visit (HOSPITAL_COMMUNITY): Payer: Self-pay | Admitting: Family Medicine

## 2017-06-27 DIAGNOSIS — Z1231 Encounter for screening mammogram for malignant neoplasm of breast: Secondary | ICD-10-CM

## 2017-08-02 ENCOUNTER — Ambulatory Visit (HOSPITAL_COMMUNITY): Payer: Medicare HMO

## 2017-08-08 ENCOUNTER — Ambulatory Visit (HOSPITAL_COMMUNITY)
Admission: RE | Admit: 2017-08-08 | Discharge: 2017-08-08 | Disposition: A | Discharge status: Home / Self Care | Payer: Medicare HMO | Source: Ambulatory Visit | Attending: Family Medicine | Admitting: Family Medicine

## 2017-08-08 ENCOUNTER — Encounter (HOSPITAL_COMMUNITY): Payer: Self-pay

## 2017-08-08 DIAGNOSIS — Z1231 Encounter for screening mammogram for malignant neoplasm of breast: Secondary | ICD-10-CM | POA: Insufficient documentation

## 2017-12-14 ENCOUNTER — Ambulatory Visit: Payer: Medicare Other | Admitting: Podiatry

## 2017-12-25 ENCOUNTER — Ambulatory Visit: Payer: Medicare HMO | Admitting: Podiatry

## 2018-01-17 ENCOUNTER — Ambulatory Visit: Payer: Medicare HMO | Admitting: Podiatry

## 2018-03-29 ENCOUNTER — Other Ambulatory Visit (HOSPITAL_COMMUNITY): Payer: Self-pay | Admitting: Family Medicine

## 2018-03-29 DIAGNOSIS — Z1231 Encounter for screening mammogram for malignant neoplasm of breast: Secondary | ICD-10-CM

## 2018-08-14 ENCOUNTER — Ambulatory Visit (HOSPITAL_COMMUNITY)
Admission: RE | Admit: 2018-08-14 | Discharge: 2018-08-14 | Disposition: A | Discharge status: Home / Self Care | Payer: Medicare HMO | Source: Ambulatory Visit | Attending: Family Medicine | Admitting: Family Medicine

## 2018-08-14 DIAGNOSIS — Z1231 Encounter for screening mammogram for malignant neoplasm of breast: Secondary | ICD-10-CM

## 2019-07-10 ENCOUNTER — Other Ambulatory Visit (HOSPITAL_COMMUNITY): Payer: Self-pay | Admitting: Family Medicine

## 2019-07-10 DIAGNOSIS — Z1231 Encounter for screening mammogram for malignant neoplasm of breast: Secondary | ICD-10-CM

## 2019-08-18 ENCOUNTER — Other Ambulatory Visit: Payer: Self-pay

## 2019-08-18 ENCOUNTER — Ambulatory Visit (HOSPITAL_COMMUNITY)
Admission: RE | Admit: 2019-08-18 | Discharge: 2019-08-18 | Disposition: A | Discharge status: Home / Self Care | Payer: Medicare HMO | Source: Ambulatory Visit | Attending: Family Medicine | Admitting: Family Medicine

## 2019-08-18 DIAGNOSIS — Z1231 Encounter for screening mammogram for malignant neoplasm of breast: Secondary | ICD-10-CM | POA: Diagnosis present

## 2019-11-21 ENCOUNTER — Ambulatory Visit: Payer: Medicare HMO | Attending: Internal Medicine

## 2019-11-21 DIAGNOSIS — Z23 Encounter for immunization: Secondary | ICD-10-CM | POA: Insufficient documentation

## 2019-11-21 NOTE — Progress Notes (Signed)
   Covid-19 Vaccination Clinic  Name:  Kendra Ayala    MRN: [id number] DOB: [birth year]  11/21/2019  [name] was observed post Covid-19 immunization for 15 minutes without incident. She was provided with Vaccine Information Sheet and instruction to access the V-Safe system.   [name] was instructed to call 911 with any severe reactions post vaccine: Marland Kitchen Difficulty breathing  . Swelling of face and throat  . A fast heartbeat  . A bad rash all over body  . Dizziness and weakness   Immunizations Administered    Name Date Dose VIS Date Route   Moderna COVID-19 Vaccine 11/21/2019  9:00 AM 0.5 mL 08/19/2019 Intramuscular   Manufacturer: Moderna   Lot: [id number]   NDC: 59935-701-77

## 2019-12-23 ENCOUNTER — Ambulatory Visit: Payer: Medicare HMO | Attending: Internal Medicine

## 2019-12-23 DIAGNOSIS — Z23 Encounter for immunization: Secondary | ICD-10-CM

## 2019-12-23 NOTE — Progress Notes (Signed)
   Covid-19 Vaccination Clinic  Name:  BRAILYNN BRETH    MRN: [id number] DOB: 10/01/1945  12/23/2019  Ms. Straus was observed post Covid-19 immunization for 15 minutes without incident. She was provided with Vaccine Information Sheet and instruction to access the V-Safe system.   Ms. Bantz was instructed to call 911 with any severe reactions post vaccine: Marland Kitchen Difficulty breathing  . Swelling of face and throat  . A fast heartbeat  . A bad rash all over body  . Dizziness and weakness   Immunizations Administered    Name Date Dose VIS Date Route   Moderna COVID-19 Vaccine 12/23/2019  9:55 AM 0.5 mL 08/19/2019 Intramuscular   Manufacturer: Moderna   Lot: [id number]   NDC: 19914-445-84

## 2020-07-13 ENCOUNTER — Other Ambulatory Visit (HOSPITAL_COMMUNITY): Payer: Self-pay | Admitting: Family Medicine

## 2020-07-13 DIAGNOSIS — Z1231 Encounter for screening mammogram for malignant neoplasm of breast: Secondary | ICD-10-CM

## 2020-08-20 ENCOUNTER — Ambulatory Visit (HOSPITAL_COMMUNITY): Payer: Medicare HMO

## 2020-08-23 ENCOUNTER — Other Ambulatory Visit: Payer: Self-pay

## 2020-08-23 ENCOUNTER — Ambulatory Visit (HOSPITAL_COMMUNITY)
Admission: RE | Admit: 2020-08-23 | Discharge: 2020-08-23 | Disposition: A | Discharge status: Home / Self Care | Payer: Medicare HMO | Source: Ambulatory Visit | Attending: Family Medicine | Admitting: Family Medicine

## 2020-08-23 DIAGNOSIS — Z1231 Encounter for screening mammogram for malignant neoplasm of breast: Secondary | ICD-10-CM | POA: Diagnosis not present

## 2021-07-21 ENCOUNTER — Other Ambulatory Visit (HOSPITAL_COMMUNITY): Payer: Self-pay | Admitting: Family Medicine

## 2021-07-21 DIAGNOSIS — Z1231 Encounter for screening mammogram for malignant neoplasm of breast: Secondary | ICD-10-CM

## 2021-08-26 ENCOUNTER — Ambulatory Visit (HOSPITAL_COMMUNITY)
Admission: RE | Admit: 2021-08-26 | Discharge: 2021-08-26 | Disposition: A | Discharge status: Home / Self Care | Payer: Medicare HMO | Source: Ambulatory Visit | Attending: Family Medicine | Admitting: Family Medicine

## 2021-08-26 ENCOUNTER — Other Ambulatory Visit: Payer: Self-pay

## 2021-08-26 DIAGNOSIS — Z1231 Encounter for screening mammogram for malignant neoplasm of breast: Secondary | ICD-10-CM | POA: Diagnosis present

## 2022-09-12 ENCOUNTER — Other Ambulatory Visit (HOSPITAL_COMMUNITY): Payer: Self-pay | Admitting: Nurse Practitioner

## 2022-09-12 DIAGNOSIS — M25551 Pain in right hip: Secondary | ICD-10-CM

## 2023-01-31 ENCOUNTER — Ambulatory Visit: Payer: Medicare HMO | Admitting: Family Medicine

## 2023-10-02 ENCOUNTER — Ambulatory Visit (INDEPENDENT_AMBULATORY_CARE_PROVIDER_SITE_OTHER): Payer: Self-pay | Admitting: Internal Medicine

## 2023-10-02 VITALS — BP 119/74 | HR 74 | Ht 67.0 in | Wt 145.0 lb

## 2023-10-02 DIAGNOSIS — Z131 Encounter for screening for diabetes mellitus: Secondary | ICD-10-CM | POA: Diagnosis not present

## 2023-10-02 DIAGNOSIS — E785 Hyperlipidemia, unspecified: Secondary | ICD-10-CM | POA: Diagnosis not present

## 2023-10-02 DIAGNOSIS — Z79891 Long term (current) use of opiate analgesic: Secondary | ICD-10-CM

## 2023-10-02 DIAGNOSIS — F32A Depression, unspecified: Secondary | ICD-10-CM | POA: Insufficient documentation

## 2023-10-02 DIAGNOSIS — Z79899 Other long term (current) drug therapy: Secondary | ICD-10-CM

## 2023-10-02 DIAGNOSIS — F419 Anxiety disorder, unspecified: Secondary | ICD-10-CM | POA: Diagnosis not present

## 2023-10-02 DIAGNOSIS — E038 Other specified hypothyroidism: Secondary | ICD-10-CM

## 2023-10-02 DIAGNOSIS — M810 Age-related osteoporosis without current pathological fracture: Secondary | ICD-10-CM

## 2023-10-02 DIAGNOSIS — M7918 Myalgia, other site: Secondary | ICD-10-CM

## 2023-10-02 DIAGNOSIS — Z1159 Encounter for screening for other viral diseases: Secondary | ICD-10-CM

## 2023-10-02 DIAGNOSIS — G8929 Other chronic pain: Secondary | ICD-10-CM | POA: Diagnosis not present

## 2023-10-02 DIAGNOSIS — D509 Iron deficiency anemia, unspecified: Secondary | ICD-10-CM

## 2023-10-02 MED ORDER — FLUOXETINE HCL 20 MG PO TABS: 20.0000 mg | ORAL_TABLET | Freq: Every day | ORAL | 3 refills | Status: DC

## 2023-10-02 NOTE — Patient Instructions (Signed)
 It was a pleasure to see you today.  Thank you for giving us  the opportunity to be involved in your care.  Below is a brief recap of your visit and next steps.  We will plan to see you again in 4-6 weeks.  Summary You have established care today Basic labs ordered Resume fluoxetine  at 20 mg daily Follow up in 4-6 weeks for reassessment

## 2023-10-02 NOTE — Assessment & Plan Note (Signed)
 She is currently prescribed Zetia 10 mg daily.  Repeat lipid panel ordered today.

## 2023-10-02 NOTE — Assessment & Plan Note (Signed)
 Previously documented history of hypothyroidism.  She is currently prescribed levothyroxine 62.5 mcg daily.  No recent thyroid studies available for review.  Repeat thyroid studies ordered today.

## 2023-10-02 NOTE — Assessment & Plan Note (Signed)
 Previously prescribed fluoxetine  40 mg daily and diazepam 5 mg twice daily.  She has been out of both medications for multiple months.  Today she states that she does not feel like herself.  She endorses insomnia and describes feeling as though she is in a bubble. -Resume fluoxetine  at 20 mg daily.  Gradually increase back to 40 mg daily -Follow-up in 4-6 weeks for reassessment

## 2023-10-02 NOTE — Assessment & Plan Note (Signed)
 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  She is currently prescribed hydrocodone-acetaminophen 7.5-325 mg 3 times daily as needed for pain relief.  PDMP reviewed.  Prescription was last filled in October.  She states that she typically takes half a tablet as needed for pain relief and does not take hydrocodone-acetaminophen every day. -PDMP reviewed.  UDS pending.  No additional prescriptions provided today.  Will review options for chronic pain control at follow-up in 4-6 weeks.  It is reasonable to continue hydrocodone-acetaminophen given the low frequency of use.

## 2023-10-02 NOTE — Progress Notes (Signed)
 New Patient Office Visit  Subjective    Patient ID: Kendra Ayala, female    DOB: 05-14-1946  Age: 78 y.o. MRN: [id number]  CC:  Chief Complaint  Patient presents with   Establish Care    HPI Kendra Ayala Marlar presents to establish care.  She is a 78 year old woman who endorses a past medical history significant for hypothyroidism, HLD, anxiety/depression, and chronic musculoskeletal pain.  Previously followed by Dr. Knowlton. Ms. Tusing reports feeling poorly today.  She states that she has not felt like herself in close to 3 months.  She endorses insomnia and feels like she is in a bubble.  She has been out of multiple medications for the last 3 months.  She does not have any additional concerns to discuss today aside from hoping to feel more like herself with resuming medications.  She is currently retired.  Denies tobacco, alcohol, and illicit drug use.  Family medical history is significant for CAD and diabetes mellitus.  Acute concerns, chronic medical conditions, and outstanding preventative care items discussed today are individually addressed in A/P below.  Outpatient Encounter Medications as of 10/02/2023  Medication Sig   acetaminophen (TYLENOL) 325 MG tablet Take 650 mg by mouth every 6 (six) hours as needed for pain.   alendronate (FOSAMAX) 70 MG tablet Take 70 mg by mouth every 7 (seven) days. Take on Friday. Take with a full glass of water  on an empty stomach.   atorvastatin (LIPITOR) 20 MG tablet Take 20 mg by mouth daily.   diazepam (VALIUM) 5 MG tablet Take 5 mg by mouth 2 (two) times daily as needed for anxiety.    FLUoxetine  (PROZAC ) 20 MG tablet Take 1 tablet (20 mg total) by mouth daily.   HYDROcodone-acetaminophen (NORCO) 7.5-325 MG tablet Take 1 tablet by mouth every 6 (six) hours as needed for moderate pain.   levothyroxine (SYNTHROID, LEVOTHROID) 125 MCG tablet Take 62.5 mcg by mouth daily before breakfast.    pantoprazole  (PROTONIX ) 40 MG tablet Take 1 tablet (40 mg  total) by mouth daily before breakfast.   Pediatric Multivitamins-Iron  (FLINTSTONES PLUS IRON ) chewable tablet Chew 1 tablet by mouth 2 (two) times daily.   [DISCONTINUED] FLUoxetine  (PROZAC ) 20 MG tablet Take 20 mg by mouth 4 (four) times daily.    [DISCONTINUED] zolpidem (AMBIEN) 10 MG tablet Take 10 mg by mouth at bedtime as needed for sleep.   polyethylene glycol-electrolytes (TRILYTE) 420 g solution Take 4,000 mLs by mouth once.   [DISCONTINUED] azithromycin  (ZITHROMAX ) 500 MG tablet Take 1 tablet (500 mg total) by mouth daily. (Patient not taking: Reported on 10/02/2023)   [DISCONTINUED] ondansetron  (ZOFRAN  ODT) 4 MG disintegrating tablet Take 1 tablet (4 mg total) by mouth every 8 (eight) hours as needed. (Patient not taking: Reported on 10/02/2023)   No facility-administered encounter medications on file as of 10/02/2023.    Past Medical History:  Diagnosis Date   IDA (iron  deficiency anemia)    Thyroid  disease     Past Surgical History:  Procedure Laterality Date   ABDOMINAL HYSTERECTOMY     COLONOSCOPY N/A 05/12/2016   Procedure: COLONOSCOPY;  Surgeon: Claudis RAYMOND Rivet, MD;  Location: [facility];  Service: Endoscopy;  Laterality: N/A;  1:00 - moved to 8/25 @ 2:40 - Ann notified pt   ESOPHAGOGASTRODUODENOSCOPY N/A 05/12/2016   Procedure: ESOPHAGOGASTRODUODENOSCOPY (EGD);  Surgeon: Claudis RAYMOND Rivet, MD;  Location: [facility];  Service: Endoscopy;  Laterality: N/A;    History reviewed. No pertinent family history.  Social History   Socioeconomic History   Marital status: Divorced    Spouse name: Not on file   Number of children: Not on file   Years of education: Not on file   Highest education level: 12th grade  Occupational History   Not on file  Tobacco Use   Smoking status: Former   Smokeless tobacco: Never   Tobacco comments:    quit smoking in the 60s  Substance and Sexual Activity   Alcohol use: Yes    Alcohol/week: 0.0 standard drinks of alcohol    Comment:  wine   Drug use: No   Sexual activity: Yes  Other Topics Concern   Not on file  Social History Narrative   Not on file   Social Drivers of Health   Financial Resource Strain: Low Risk  (10/02/2023)   Overall Financial Resource Strain (CARDIA)    Difficulty of Paying Living Expenses: Not hard at all  Food Insecurity: No Food Insecurity (10/02/2023)   Hunger Vital Sign    Worried About Running Out of Food in the Last Year: Never true    Ran Out of Food in the Last Year: Never true  Transportation Needs: No Transportation Needs (10/02/2023)   PRAPARE - Administrator, Civil Service (Medical): No    Lack of Transportation (Non-Medical): No  Physical Activity: Unknown (10/02/2023)   Exercise Vital Sign    Days of Exercise per Week: 0 days    Minutes of Exercise per Session: Not on file  Stress: Stress Concern Present (10/02/2023)   [employer] of Occupational Health - Occupational Stress Questionnaire    Feeling of Stress : Very much  Social Connections: Socially Isolated (10/02/2023)   Social Connection and Isolation Panel [NHANES]    Frequency of Communication with Friends and Family: Never    Frequency of Social Gatherings with Friends and Family: Never    Attends Religious Services: Never    Database Administrator or Organizations: No    Attends Engineer, Structural: Not on file    Marital Status: Widowed  Intimate Partner Violence: Not on file   Review of Systems  Constitutional:  Negative for chills and fever.  HENT:  Negative for sore throat.   Respiratory:  Negative for cough and shortness of breath.   Cardiovascular:  Negative for chest pain, palpitations and leg swelling.  Gastrointestinal:  Negative for abdominal pain, blood in stool, constipation, diarrhea, nausea and vomiting.  Genitourinary:  Negative for dysuria and hematuria.  Musculoskeletal:  Positive for back pain and joint pain. Negative for myalgias.  Skin:  Negative for itching and  rash.  Neurological:  Negative for dizziness and headaches.  Psychiatric/Behavioral:  Negative for depression and suicidal ideas. The patient is nervous/anxious and has insomnia.    Objective    BP 119/74 (BP Location: Left Arm, Patient Position: Sitting, Cuff Size: Normal)   Pulse 74   Ht 5' 7 (1.702 m)   Wt 145 lb (65.8 kg)   SpO2 96%   BMI 22.71 kg/m   Physical Exam Vitals reviewed.  Constitutional:      General: She is not in acute distress.    Appearance: Normal appearance. She is not toxic-appearing.  HENT:     Head: Normocephalic and atraumatic.     Right Ear: External ear normal.     Left Ear: External ear normal.     Nose: Nose normal. No congestion or rhinorrhea.     Mouth/Throat:  Mouth: Mucous membranes are moist.     Pharynx: Oropharynx is clear. No oropharyngeal exudate or posterior oropharyngeal erythema.  Eyes:     General: No scleral icterus.    Extraocular Movements: Extraocular movements intact.     Conjunctiva/sclera: Conjunctivae normal.     Pupils: Pupils are equal, round, and reactive to light.  Cardiovascular:     Rate and Rhythm: Normal rate and regular rhythm.     Pulses: Normal pulses.     Heart sounds: Normal heart sounds. No murmur heard.    No friction rub. No gallop.  Pulmonary:     Effort: Pulmonary effort is normal.     Breath sounds: Normal breath sounds. No wheezing, rhonchi or rales.  Abdominal:     General: Abdomen is flat. Bowel sounds are normal. There is no distension.     Palpations: Abdomen is soft.     Tenderness: There is no abdominal tenderness.  Musculoskeletal:        General: No swelling.     Cervical back: Normal range of motion.     Right lower leg: No edema.     Left lower leg: No edema.  Lymphadenopathy:     Cervical: No cervical adenopathy.  Skin:    General: Skin is warm and dry.     Capillary Refill: Capillary refill takes less than 2 seconds.     Coloration: Skin is not jaundiced.  Neurological:      General: No focal deficit present.     Mental Status: She is alert and oriented to person, place, and time.  Psychiatric:        Mood and Affect: Mood normal.        Behavior: Behavior normal.    Assessment & Plan:   Problem List Items Addressed This Visit       Hypothyroidism   Previously documented history of hypothyroidism.  She is currently prescribed levothyroxine 62.5 mcg daily.  No recent thyroid  studies available for review.  Repeat thyroid  studies ordered today.      Hyperlipidemia   She is currently prescribed Zetia 10 mg daily.  Repeat lipid panel ordered today.      Anxiety and depression - Primary   Previously prescribed fluoxetine  40 mg daily and diazepam 5 mg twice daily.  She has been out of both medications for multiple months.  Today she states that she does not feel like herself.  She endorses insomnia and describes feeling as though she is in a bubble. -Resume fluoxetine  at 20 mg daily.  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  She is currently prescribed hydrocodone-acetaminophen 7.5-325 mg 3 times daily as needed for pain relief.  PDMP reviewed.  Prescription was last filled in October.  She states that she typically takes half a tablet as needed for pain relief and does not take hydrocodone-acetaminophen every day. -PDMP reviewed.  UDS pending.  No additional prescriptions provided today.  Will review options for chronic pain control at follow-up in 4-6 weeks.  It is reasonable to continue hydrocodone-acetaminophen given the low frequency of use.      Return in about 4 weeks (around 10/30/2023) for Anxiety, Lab review.   Manus FORBES Fireman, MD

## 2023-10-04 LAB — CMP14+EGFR
ALT: 9 [IU]/L (ref 0–32)
AST: 18 [IU]/L (ref 0–40)
Albumin: 4 g/dL (ref 3.8–4.8)
Alkaline Phosphatase: 71 [IU]/L (ref 44–121)
BUN/Creatinine Ratio: 8 — ABNORMAL LOW (ref 12–28)
BUN: 6 mg/dL — ABNORMAL LOW (ref 8–27)
Bilirubin Total: 0.3 mg/dL (ref 0.0–1.2)
CO2: 23 mmol/L (ref 20–29)
Calcium: 9.2 mg/dL (ref 8.7–10.3)
Chloride: 94 mmol/L — ABNORMAL LOW (ref 96–106)
Creatinine, Ser: 0.71 mg/dL (ref 0.57–1.00)
Globulin, Total: 3.6 g/dL (ref 1.5–4.5)
Glucose: 90 mg/dL (ref 70–99)
Potassium: 3.8 mmol/L (ref 3.5–5.2)
Sodium: 133 mmol/L — ABNORMAL LOW (ref 134–144)
Total Protein: 7.6 g/dL (ref 6.0–8.5)
eGFR: 89 mL/min/{1.73_m2} (ref >59)

## 2023-10-04 LAB — HCV AB W REFLEX TO QUANT PCR: HCV Ab: NONREACTIVE

## 2023-10-04 LAB — CBC WITH DIFFERENTIAL/PLATELET
Basophils Absolute: 0 10*3/uL (ref 0.0–0.2)
Basos: 0 %
EOS (ABSOLUTE): 0 10*3/uL (ref 0.0–0.4)
Eos: 1 %
Hematocrit: 35.1 % (ref 34.0–46.6)
Hemoglobin: 11.3 g/dL (ref 11.1–15.9)
Immature Grans (Abs): 0 10*3/uL (ref 0.0–0.1)
Immature Granulocytes: 0 %
Lymphocytes Absolute: 2.1 10*3/uL (ref 0.7–3.1)
Lymphs: 35 %
MCH: 26 pg — ABNORMAL LOW (ref 26.6–33.0)
MCHC: 32.2 g/dL (ref 31.5–35.7)
MCV: 81 fL (ref 79–97)
Monocytes Absolute: 0.4 10*3/uL (ref 0.1–0.9)
Monocytes: 7 %
Neutrophils Absolute: 3.4 10*3/uL (ref 1.4–7.0)
Neutrophils: 57 %
Platelets: 365 10*3/uL (ref 150–450)
RBC: 4.35 x10E6/uL (ref 3.77–5.28)
RDW: 13.8 % (ref 11.7–15.4)
WBC: 6 10*3/uL (ref 3.4–10.8)

## 2023-10-04 LAB — TSH+FREE T4
Free T4: 0.87 ng/dL (ref 0.82–1.77)
TSH: 15.5 u[IU]/mL — ABNORMAL HIGH (ref 0.450–4.500)

## 2023-10-04 LAB — LIPID PANEL
Chol/HDL Ratio: 3.4 {ratio} (ref 0.0–4.4)
Cholesterol, Total: 222 mg/dL — ABNORMAL HIGH (ref 100–199)
HDL: 66 mg/dL (ref >39)
LDL Chol Calc (NIH): 147 mg/dL — ABNORMAL HIGH (ref 0–99)
Triglycerides: 55 mg/dL (ref 0–149)
VLDL Cholesterol Cal: 9 mg/dL (ref 5–40)

## 2023-10-04 LAB — HCV INTERPRETATION

## 2023-10-04 LAB — VITAMIN D 25 HYDROXY (VIT D DEFICIENCY, FRACTURES): Vit D, 25-Hydroxy: 27.2 ng/mL — ABNORMAL LOW (ref 30.0–100.0)

## 2023-10-04 LAB — B12 AND FOLATE PANEL
Folate: 15.3 ng/mL (ref >3.0)
Vitamin B-12: 499 pg/mL (ref 232–1245)

## 2023-10-04 LAB — HEMOGLOBIN A1C
Est. average glucose Bld gHb Est-mCnc: 117 mg/dL
Hgb A1c MFr Bld: 5.7 % — ABNORMAL HIGH (ref 4.8–5.6)

## 2023-10-05 LAB — TOXASSURE SELECT 13 (MW), URINE

## 2023-11-19 ENCOUNTER — Telehealth: Payer: Self-pay | Admitting: Internal Medicine

## 2023-11-19 ENCOUNTER — Ambulatory Visit: Payer: Self-pay | Admitting: Internal Medicine

## 2023-11-19 NOTE — Telephone Encounter (Signed)
 Called pt to attempt to triage. "Call cannot be completed as dialed" - RN unable to leave VM. Will route to callbacks for continued attempts.  Copied from CRM [id number]. Topic: Clinical - Red Word Triage >> Nov 19, 2023  3:59 PM Turkey B wrote: Pt is taking more than rx of prozac for anxiety, taking 2 instead of 1

## 2023-11-19 NOTE — Telephone Encounter (Signed)
 Copied from CRM [id number]. Topic: Clinical - Medication Question >> Nov 19, 2023  3:34 PM Turkey B wrote: Reason for CRM Disregard, this has been sent to NT

## 2023-11-19 NOTE — Telephone Encounter (Addendum)
 This RN made 3rd attempt tor each pt, "call cannot be completed as dialed" message. Routing to clinic for follow up.  This RN made 2nd attempt to reach pt, "call cannot be completed as dialed' message.

## 2023-11-20 NOTE — Telephone Encounter (Signed)
 Unable to reach patient.

## 2023-11-30 ENCOUNTER — Telehealth: Payer: Self-pay

## 2023-11-30 DIAGNOSIS — F32A Depression, unspecified: Secondary | ICD-10-CM

## 2023-11-30 MED ORDER — FLUOXETINE HCL 40 MG PO CAPS: 40.0000 mg | ORAL_CAPSULE | Freq: Every day | ORAL | 3 refills | Status: AC

## 2023-11-30 NOTE — Telephone Encounter (Signed)
 Copied from CRM [id number]. Topic: Clinical - Prescription Issue >> Nov 30, 2023  2:13 PM Marlow Baars wrote: Reason for CRM: Tori with [facility] called in stating the patient has been taking her FLUoxetine (PROZAC) 20 MG tablet as 1 pill twice a day even though the directs say Take 1 tablet (20 mg total) by mouth daily. She says the patient told her that the provider knows about this but if this is the case they need a new prescription indicating this to be sent in. Please assist further by sending to  [facility] [place], [place] - [address]  Phone: [phone] Fax: [phone]

## 2023-12-03 NOTE — Telephone Encounter (Signed)
 Patient advised.

## 2024-02-05 ENCOUNTER — Ambulatory Visit: Payer: Self-pay | Admitting: Internal Medicine

## 2024-06-21 ENCOUNTER — Other Ambulatory Visit: Payer: Self-pay | Admitting: Internal Medicine

## 2024-06-21 DIAGNOSIS — F419 Anxiety disorder, unspecified: Secondary | ICD-10-CM

## 2024-08-08 ENCOUNTER — Other Ambulatory Visit: Payer: Self-pay | Admitting: Internal Medicine

## 2024-08-08 DIAGNOSIS — F32A Depression, unspecified: Secondary | ICD-10-CM

## 2024-10-01 ENCOUNTER — Other Ambulatory Visit: Payer: Self-pay | Admitting: Internal Medicine

## 2024-10-01 DIAGNOSIS — F32A Depression, unspecified: Secondary | ICD-10-CM

## 2024-10-03 ENCOUNTER — Ambulatory Visit: Payer: Self-pay

## 2024-10-03 ENCOUNTER — Other Ambulatory Visit: Payer: Self-pay | Admitting: Internal Medicine

## 2024-10-03 DIAGNOSIS — F419 Anxiety disorder, unspecified: Secondary | ICD-10-CM

## 2024-10-03 NOTE — Telephone Encounter (Signed)
" °  FYI Only or Action Required?: FYI only for provider: UC/ ED advised.  Patient was last seen in primary care on 10/02/2023 by Melvenia Manus BRAVO, MD.  Called Nurse Triage reporting Anxiety.  Symptoms began several weeks ago.  Interventions attempted: Nothing.  Symptoms are: gradually worsening.  Triage Disposition: See PCP When Office is Open (Within 3 Days)  Patient/caregiver understands and will follow disposition?: Yes   Reason for Triage: patient states she is down to her last pill on FLUoxetine  (PROZAC ) 40 MG capsule and needs a refill. She says that she is losing her mind and needs the medication now and wants to know if she needs to call 911 or go to the ER.   Reason for Disposition  MODERATE anxiety (e.g., persistent or frequent anxiety symptoms; interferes with sleep, school, or work)  Answer Assessment - Initial Assessment Questions 1. CONCERN: Did anything happen that prompted you to call today?      Only has 1 prozac  left. Requesting refill 2. ANXIETY SYMPTOMS: Can you describe how you (your loved one; patient) have been feeling? (e.g., tense, restless, panicky, anxious, keyed up, overwhelmed, sense of impending doom).      anxious 3. ONSET: How long have you been feeling this way? (e.g., hours, days, weeks)     Several days 4. SEVERITY: How would you rate the level of anxiety? (e.g., 0 - 10; or mild, moderate, severe).     moderate 5. FUNCTIONAL IMPAIRMENT: How have these feelings affected your ability to do daily activities? Have you had more difficulty than usual doing your normal daily activities? (e.g., getting better, same, worse; self-care, school, work, interactions)      6. HISTORY: Have you felt this way before? Have you ever been diagnosed with an anxiety problem in the past? (e.g., generalized anxiety disorder, panic attacks, PTSD). If Yes, ask: How was this problem treated? (e.g., medicines, counseling, etc.)      7. RISK OF HARM - SUICIDAL  IDEATION: Do you ever have thoughts of hurting or killing yourself? If Yes, ask:  Do you have these feelings now? Do you have a plan on how you would do this?     denies 8. TREATMENT:  What has been done so far to treat this anxiety? (e.g., medicines, relaxation strategies). What has helped?      9. THERAPIST: Do you have a counselor or therapist? If Yes, ask: What is their name?     yes 10. POTENTIAL TRIGGERS: Do you drink caffeinated beverages (e.g., coffee, colas, teas), and how much daily? Do you drink alcohol or use any drugs? Have you started any new medicines recently?        11. PATIENT SUPPORT: Who is with you now? Who do you live with? Do you have family or friends who you can talk to?        no 12. OTHER SYMPTOMS: Do you have any other symptoms? (e.g., feeling depressed, trouble concentrating, trouble sleeping, trouble breathing, palpitations or fast heartbeat, chest pain, sweating, nausea, or diarrhea)       denies  Protocols used: Anxiety and Panic Attack-A-AH  "
